# Patient Record
Sex: Male | Born: 1965 | Race: White | Hispanic: No | Marital: Married | Smoking: Former smoker
Health system: Southern US, Community
[De-identification: ages and names within clinical notes are randomized; demographics above are authoritative.]

## PROBLEM LIST (undated history)

## (undated) DIAGNOSIS — K5792 Diverticulitis of intestine, part unspecified, without perforation or abscess without bleeding: Secondary | ICD-10-CM

## (undated) DIAGNOSIS — K219 Gastro-esophageal reflux disease without esophagitis: Secondary | ICD-10-CM

## (undated) DIAGNOSIS — K31A0 Gastric intestinal metaplasia, unspecified: Secondary | ICD-10-CM

## (undated) DIAGNOSIS — K589 Irritable bowel syndrome without diarrhea: Secondary | ICD-10-CM

## (undated) DIAGNOSIS — K2970 Gastritis, unspecified, without bleeding: Secondary | ICD-10-CM

## (undated) DIAGNOSIS — K224 Dyskinesia of esophagus: Secondary | ICD-10-CM

## (undated) DIAGNOSIS — F32A Depression, unspecified: Secondary | ICD-10-CM

## (undated) DIAGNOSIS — K3189 Other diseases of stomach and duodenum: Secondary | ICD-10-CM

## (undated) DIAGNOSIS — I10 Essential (primary) hypertension: Secondary | ICD-10-CM

## (undated) DIAGNOSIS — F329 Major depressive disorder, single episode, unspecified: Secondary | ICD-10-CM

## (undated) DIAGNOSIS — K229 Disease of esophagus, unspecified: Secondary | ICD-10-CM

## (undated) HISTORY — PX: CHOLECYSTECTOMY: SHX55

## (undated) HISTORY — PX: GALLBLADDER SURGERY: SHX652

## (undated) HISTORY — PX: WRIST SURGERY: SHX841

## (undated) HISTORY — PX: COLONOSCOPY: SHX174

## (undated) HISTORY — PX: KNEE ARTHROSCOPY: SUR90

---

## 2005-09-23 ENCOUNTER — Emergency Department: Payer: Self-pay | Admitting: Emergency Medicine

## 2005-09-23 ENCOUNTER — Other Ambulatory Visit: Payer: Self-pay

## 2005-09-25 ENCOUNTER — Ambulatory Visit: Payer: Self-pay | Admitting: Emergency Medicine

## 2005-09-28 ENCOUNTER — Ambulatory Visit: Payer: Self-pay | Admitting: Family Medicine

## 2005-10-02 ENCOUNTER — Ambulatory Visit: Payer: Self-pay | Admitting: Family Medicine

## 2007-04-07 ENCOUNTER — Emergency Department: Payer: Self-pay | Admitting: Emergency Medicine

## 2007-04-18 ENCOUNTER — Emergency Department: Payer: Self-pay | Admitting: Emergency Medicine

## 2012-12-14 ENCOUNTER — Ambulatory Visit: Payer: Self-pay | Admitting: Orthopedic Surgery

## 2013-01-02 ENCOUNTER — Ambulatory Visit: Payer: Self-pay | Admitting: Orthopedic Surgery

## 2013-01-02 LAB — PROTIME-INR
INR: 0.9
Prothrombin Time: 12.8 secs (ref 11.5–14.7)

## 2013-01-02 LAB — BASIC METABOLIC PANEL
BUN: 13 mg/dL (ref 7–18)
Co2: 29 mmol/L (ref 21–32)
Glucose: 97 mg/dL (ref 65–99)
Osmolality: 283 (ref 275–301)

## 2013-01-02 LAB — CBC
MCV: 91 fL (ref 80–100)
Platelet: 155 10*3/uL (ref 150–440)
RBC: 4.24 10*6/uL — ABNORMAL LOW (ref 4.40–5.90)
RDW: 12.7 % (ref 11.5–14.5)

## 2013-01-10 ENCOUNTER — Inpatient Hospital Stay: Payer: Self-pay | Admitting: Gastroenterology

## 2013-01-10 LAB — CBC WITH DIFFERENTIAL/PLATELET
Eosinophil #: 0.1 10*3/uL (ref 0.0–0.7)
Eosinophil %: 2.2 %
Lymphocyte #: 1.3 10*3/uL (ref 1.0–3.6)
Lymphocyte %: 32.5 %
MCV: 91 fL (ref 80–100)
Monocyte #: 0.3 x10 3/mm (ref 0.2–1.0)
Monocyte %: 8.3 %
Neutrophil #: 2.2 10*3/uL (ref 1.4–6.5)
RBC: 4.86 10*6/uL (ref 4.40–5.90)
RDW: 12.6 % (ref 11.5–14.5)

## 2013-01-10 LAB — COMPREHENSIVE METABOLIC PANEL
Anion Gap: 5 — ABNORMAL LOW (ref 7–16)
Bilirubin,Total: 0.7 mg/dL (ref 0.2–1.0)
Calcium, Total: 8.6 mg/dL (ref 8.5–10.1)
Chloride: 109 mmol/L — ABNORMAL HIGH (ref 98–107)
Co2: 27 mmol/L (ref 21–32)
Creatinine: 1.1 mg/dL (ref 0.60–1.30)
EGFR (Non-African Amer.): >60
Glucose: 93 mg/dL (ref 65–99)
Potassium: 4.3 mmol/L (ref 3.5–5.1)
SGOT(AST): 156 U/L — ABNORMAL HIGH (ref 15–37)

## 2013-01-12 LAB — COMPREHENSIVE METABOLIC PANEL
Alkaline Phosphatase: 97 U/L (ref 50–136)
BUN: 11 mg/dL (ref 7–18)
Bilirubin,Total: 0.5 mg/dL (ref 0.2–1.0)
Calcium, Total: 8.6 mg/dL (ref 8.5–10.1)
Chloride: 109 mmol/L — ABNORMAL HIGH (ref 98–107)
Co2: 27 mmol/L (ref 21–32)
Creatinine: 1.19 mg/dL (ref 0.60–1.30)
EGFR (African American): >60
EGFR (Non-African Amer.): >60
Glucose: 96 mg/dL (ref 65–99)
Osmolality: 281 (ref 275–301)
Potassium: 3.9 mmol/L (ref 3.5–5.1)
SGOT(AST): 103 U/L — ABNORMAL HIGH (ref 15–37)
SGPT (ALT): 202 U/L — ABNORMAL HIGH (ref 12–78)
Sodium: 141 mmol/L (ref 136–145)
Total Protein: 6.5 g/dL (ref 6.4–8.2)

## 2013-01-12 LAB — CBC WITH DIFFERENTIAL/PLATELET
Basophil %: 0.8 %
Eosinophil #: 0.1 10*3/uL (ref 0.0–0.7)
Eosinophil %: 2.2 %
Lymphocyte #: 1.8 10*3/uL (ref 1.0–3.6)
Lymphocyte %: 35.5 %
MCV: 91 fL (ref 80–100)
Monocyte #: 0.4 x10 3/mm (ref 0.2–1.0)
Neutrophil #: 2.7 10*3/uL (ref 1.4–6.5)
Platelet: 175 10*3/uL (ref 150–440)
RDW: 12.6 % (ref 11.5–14.5)
WBC: 5.1 10*3/uL (ref 3.8–10.6)

## 2013-01-14 LAB — HEPATIC FUNCTION PANEL A (ARMC)
Bilirubin, Direct: 0.2 mg/dL (ref 0.00–0.20)
SGOT(AST): 71 U/L — ABNORMAL HIGH (ref 15–37)

## 2013-02-27 ENCOUNTER — Ambulatory Visit: Payer: Self-pay | Admitting: Gastroenterology

## 2013-04-10 ENCOUNTER — Ambulatory Visit: Payer: Self-pay | Admitting: Orthopedic Surgery

## 2013-04-10 LAB — PROTIME-INR
INR: 0.9
Prothrombin Time: 12.8 secs (ref 11.5–14.7)

## 2013-04-10 LAB — CBC
HGB: 14 g/dL (ref 13.0–18.0)
MCH: 31.5 pg (ref 26.0–34.0)
RBC: 4.43 10*6/uL (ref 4.40–5.90)
RDW: 12.9 % (ref 11.5–14.5)
WBC: 5.6 10*3/uL (ref 3.8–10.6)

## 2013-04-10 LAB — APTT: Activated PTT: 27.8 secs (ref 23.6–35.9)

## 2013-04-10 LAB — URINALYSIS, COMPLETE
Bilirubin,UR: NEGATIVE
Blood: NEGATIVE
Nitrite: NEGATIVE
Ph: 7 (ref 4.5–8.0)
RBC,UR: <1 /HPF (ref 0–5)
Specific Gravity: 1.008 (ref 1.003–1.030)
Squamous Epithelial: NONE SEEN
WBC UR: <1 /HPF (ref 0–5)

## 2013-04-10 LAB — BASIC METABOLIC PANEL
Anion Gap: 3 — ABNORMAL LOW (ref 7–16)
Calcium, Total: 9.2 mg/dL (ref 8.5–10.1)
Co2: 31 mmol/L (ref 21–32)
Creatinine: 1.03 mg/dL (ref 0.60–1.30)
EGFR (Non-African Amer.): >60
Potassium: 3.8 mmol/L (ref 3.5–5.1)

## 2013-04-18 ENCOUNTER — Ambulatory Visit: Payer: Self-pay | Admitting: Orthopedic Surgery

## 2014-01-10 ENCOUNTER — Ambulatory Visit: Payer: Self-pay | Admitting: Orthopedic Surgery

## 2014-02-09 ENCOUNTER — Ambulatory Visit: Payer: Self-pay | Admitting: Orthopedic Surgery

## 2014-02-09 LAB — BASIC METABOLIC PANEL
Anion Gap: 3 — ABNORMAL LOW (ref 7–16)
BUN: 15 mg/dL (ref 7–18)
Calcium, Total: 8.8 mg/dL (ref 8.5–10.1)
Chloride: 109 mmol/L — ABNORMAL HIGH (ref 98–107)
Co2: 29 mmol/L (ref 21–32)
Creatinine: 1.21 mg/dL (ref 0.60–1.30)
EGFR (African American): >60
Glucose: 85 mg/dL (ref 65–99)
OSMOLALITY: 281 (ref 275–301)
POTASSIUM: 3.7 mmol/L (ref 3.5–5.1)
Sodium: 141 mmol/L (ref 136–145)

## 2014-02-09 LAB — CBC
HCT: 40.9 % (ref 40.0–52.0)
HGB: 13.9 g/dL (ref 13.0–18.0)
MCH: 30.7 pg (ref 26.0–34.0)
MCHC: 33.9 g/dL (ref 32.0–36.0)
MCV: 91 fL (ref 80–100)
PLATELETS: 154 10*3/uL (ref 150–440)
RBC: 4.51 10*6/uL (ref 4.40–5.90)
RDW: 12.6 % (ref 11.5–14.5)
WBC: 6 10*3/uL (ref 3.8–10.6)

## 2014-02-09 LAB — APTT: Activated PTT: 28.1 secs (ref 23.6–35.9)

## 2014-02-09 LAB — SEDIMENTATION RATE: Erythrocyte Sed Rate: 5 mm/hr (ref 0–15)

## 2014-02-09 LAB — PROTIME-INR
INR: 1
Prothrombin Time: 12.8 secs (ref 11.5–14.7)

## 2014-02-12 ENCOUNTER — Ambulatory Visit: Payer: Self-pay | Admitting: Orthopedic Surgery

## 2014-08-04 ENCOUNTER — Emergency Department: Payer: Self-pay | Admitting: Emergency Medicine

## 2014-08-04 LAB — TROPONIN I: Troponin-I: <0.02 ng/mL

## 2014-08-04 LAB — BASIC METABOLIC PANEL
ANION GAP: 6 — AB (ref 7–16)
BUN: 14 mg/dL (ref 7–18)
Calcium, Total: 9 mg/dL (ref 8.5–10.1)
Chloride: 109 mmol/L — ABNORMAL HIGH (ref 98–107)
Co2: 26 mmol/L (ref 21–32)
Creatinine: 1.18 mg/dL (ref 0.60–1.30)
Glucose: 123 mg/dL — ABNORMAL HIGH (ref 65–99)
OSMOLALITY: 283 (ref 275–301)
POTASSIUM: 4 mmol/L (ref 3.5–5.1)
Sodium: 141 mmol/L (ref 136–145)

## 2014-08-04 LAB — CBC
HCT: 47.5 % (ref 40.0–52.0)
HGB: 15.6 g/dL (ref 13.0–18.0)
MCH: 29.8 pg (ref 26.0–34.0)
MCHC: 32.8 g/dL (ref 32.0–36.0)
MCV: 91 fL (ref 80–100)
Platelet: 193 10*3/uL (ref 150–440)
RBC: 5.23 10*6/uL (ref 4.40–5.90)
RDW: 12.3 % (ref 11.5–14.5)
WBC: 9.6 10*3/uL (ref 3.8–10.6)

## 2014-08-05 ENCOUNTER — Ambulatory Visit: Payer: Self-pay | Admitting: Gastroenterology

## 2014-08-28 ENCOUNTER — Ambulatory Visit: Payer: Self-pay | Admitting: Gastroenterology

## 2014-09-03 LAB — PATHOLOGY REPORT

## 2014-09-09 ENCOUNTER — Ambulatory Visit: Payer: Self-pay | Admitting: Gastroenterology

## 2014-10-08 DIAGNOSIS — K589 Irritable bowel syndrome without diarrhea: Secondary | ICD-10-CM | POA: Insufficient documentation

## 2014-11-06 ENCOUNTER — Ambulatory Visit: Payer: Self-pay | Admitting: Orthopedic Surgery

## 2015-03-26 NOTE — Consult Note (Signed)
Chief Complaint:  Subjective/Chief Complaint Sl improvement. Still with LLQ abd pain. Some rectal bleeding yest. Poor appetite. LFT abnormal but CT of liver normal. Hep A/B/C serologies neg.   VITAL SIGNS/ANCILLARY NOTES: **Vital Signs.:   09-Feb-14 05:31  Vital Signs Type Routine  Temperature Temperature (F) 97.7  Celsius 36.5  Temperature Source oral  Pulse Pulse 68  Respirations Respirations 20  Systolic BP Systolic BP 200  Diastolic BP (mmHg) Diastolic BP (mmHg) 89  Mean BP 105  Pulse Ox % Pulse Ox % 93  Pulse Ox Activity Level  At rest  Oxygen Delivery Room Air/ 21 %   Brief Assessment:  Cardiac Regular   Respiratory clear BS   Gastrointestinal LLQ abd tenderness   Lab Results: Hepatic:  09-Feb-14 04:33   Bilirubin, Total 0.5  Alkaline Phosphatase 97  SGPT (ALT)  202  SGOT (AST)  103  Total Protein, Serum 6.5  Albumin, Serum 3.6  Routine Chem:  09-Feb-14 04:33   Glucose, Serum 96  BUN 11  Creatinine (comp) 1.19  Sodium, Serum 141  Potassium, Serum 3.9  Chloride, Serum  109  CO2, Serum 27  Calcium (Total), Serum 8.6  Osmolality (calc) 281  eGFR (African American) >60  eGFR (Non-African American) >60 (eGFR values <43m/min/1.73 m2 may be an indication of chronic kidney disease (CKD). Calculated eGFR is useful in patients with stable renal function. The eGFR calculation will not be reliable in acutely ill patients when serum creatinine is changing rapidly. It is not useful in  patients on dialysis. The eGFR calculation may not be applicable to patients at the low and high extremes of body sizes, pregnant women, and vegetarians.)  Anion Gap  5  Routine Hem:  09-Feb-14 04:33   WBC (CBC) 5.1  RBC (CBC) 4.66  Hemoglobin (CBC) 14.5  Hematocrit (CBC) 42.2  Platelet Count (CBC) 175  MCV 91  MCH 31.1  MCHC 34.3  RDW 12.6  Neutrophil % 53.9  Lymphocyte % 35.5  Monocyte % 7.6  Eosinophil % 2.2  Basophil % 0.8  Neutrophil # 2.7  Lymphocyte # 1.8   Monocyte # 0.4  Eosinophil # 0.1  Basophil # 0.0 (Result(s) reported on 12 Jan 2013 at 06:04AM.)   Assessment/Plan:  Assessment/Plan:  Assessment Poss diverticulitis.   Plan Continue Abx. Moniter LFT. If no signif improvement, consider flex sig/colonoscopy to evaluate left side of colon. Dr. SGustavo Lahto resuming seeing patient tomorrow. Thanks.   Electronic Signatures: OVerdie Shire(MD)  (Signed 09-Feb-14 10:05)  Authored: Chief Complaint, VITAL SIGNS/ANCILLARY NOTES, Brief Assessment, Lab Results, Assessment/Plan   Last Updated: 09-Feb-14 10:05 by OVerdie Shire(MD)

## 2015-03-26 NOTE — Op Note (Signed)
PATIENT NAME:  Mark, Bates MR#:  [id number] DATE OF BIRTH:  Sep 18, 1966  DATE OF PROCEDURE:  04/18/2013  PREOPERATIVE DIAGNOSIS: Left knee medial meniscus tear.  POSTOPERATIVE DIAGNOSIS: Left knee medial meniscus tear.  PROCEDURE:  Left knee arthroscopic partial medial meniscectomy.   ANESTHESIA:  General.   SPECIMENS:  None.  SURGEON:  Dr. Thornton Park  ESTIMATED BLOOD LOSS:  Minimal.   COMPLICATIONS:  None.   INDICATIONS FOR PROCEDURE: Mark Bates is a 49 year old male who has had persistent left knee pain with mechanical symptoms. An MRI had revealed a tear of the medial meniscus within the body and extending to the posterior horn. The patient had not responded to nonoperative management and had wished to proceed with arthroscopic partial medial meniscectomy. His initial surgery was delayed because he had developed diverticulitis. He has now been cleared for surgery. The patient's initial injury began when he twisted his knee rising from his desk at work. His symptoms began in approximately December 2013. He had a previous history of a right bucket handle meniscal tear on the right knee, which was treated arthroscopically.   I reviewed the risks and benefits of surgery with Mark Bates. He understands the risks include infection, bleeding, nerve or blood vessel injury, knee stiffness, persistent pain, osteoarthritis and the need for further surgery. Medical complications include DVT and pulmonary embolism, myocardial infarction, stroke, pneumonia, respiratory failure and death. The patient understood these risks and wished to proceed.   PROCEDURE NOTE:  Mark Bates was met in the preop area with his wife at the bedside. I signed the left knee within the operative field according to the hospital's right-site protocol. I signed his knee after verbally confirming with the patient that this was the correct site of surgery. This was also confirmed using my office notes and his radiographic  studies. I updated the patient's history and physical. I answered all the questions by the patient and his wife.   The patient was brought to the operating room where he was placed supine on the operative table. He underwent general anesthesia. All bony prominences were adequately padded. The patient was prepped and draped in a sterile fashion.   A timeout was performed to verify the patient's name, date of birth, medical record number, correct site of surgery and correct procedure to be performed. It was also used to verify the patient had received antibiotics and all appropriate instruments, implants and radiographic studies were available in the room. Once all in attendance were in agreement, the case began.   Examination under anesthesia of the left knee demonstrated no evidence of ligamentous laxity. He had full range of motion.  A #11 blade was used to establish an inferolateral portal. The proposed incisions had been drawn out with a surgical marker based upon bony landmarks. The incision sites were preinjected with 1% lidocaine plain. The arthroscope was placed through the inferolateral portal and placed in the suprapatellar pouch as the knee was gently brought into extension. A full diagnostic examination of the knee was performed including the suprapatellar pouch, the medial and lateral gutters, the medial and lateral compartments and the intercondylar notch, as well as the posterior knee. Findings on arthroscopy included a complex tear of the medial meniscus extending from the body into the posterior horn. There was mild fissuring of the central portion of the undersurface of the patella, as well as the lateral femoral condyle. No focal chondral defects were seen in any compartment of the knee. The patient also had  hypertrophy of Hoffa's fat pad extending into the lateral compartment, as well as a fibrous scar band extending from the anterolateral knee into the intercondylar notch.   An  inferomedial portal was established under direct visualization using the 18-gauge spinal needle for localization. A 4-0 resector shaver blade was then used to debride the scar band and the Hoffa's fat pad laterally. The patient was having anterolateral knee pain which may have been from impingement of Hoffa's fat pad.   The attention was then turned to the medial meniscus. A 4-0 resector shaver blade was used to debride the body along with a straight basket for debridement. The tear was found to be extensive and involved the undersurface of the medial meniscus. This was thoroughly debrided. The tear also extended in a horizontal fashion into the posterior horn of the medial meniscus. An up-biting basket and a 3.5 mm resector shaver blade were used to perform the debridement on the posterior horn avoiding injury to the medial femoral condyle or tibial plateau. The arthroscope was then placed through the inferomedial portal and the straight basket and 4-0 resector shaver blade were used to debride the anterior portion of the body and to contour the meniscus into the anterior horn.  Final images of the partial medial meniscectomy were performed, Thorough irrigation of the left knee was then performed. The knee was then drained of fluid and the arthroscopic instruments were removed. A 4-0 nylon suture was used to close both the medial and lateral portals. Steri-Strips were applied. The patient was injected with 0.25% Marcaine with epi intra-articularly for a total of 20 mL. Dry sterile compressive bandage was then applied to the left knee. The patient was then awakened and brought to the PACU in stable condition. I was scrubbed and present for the entire case and all sharp and instrument counts were correct at the conclusion of the case. I met with the patient's wife in the postoperative consultation room to let her know the case had gone without complication. The patient was stable in the recovery  room.   ____________________________ Timoteo Gaul, MD klk:ce D: 04/18/2013 12:00:43 ET T: 04/18/2013 12:12:55 ET JOB#: [id number]  cc: Timoteo Gaul, MD, <Dictator> Timoteo Gaul MD ELECTRONICALLY SIGNED 04/22/2013 10:34

## 2015-03-26 NOTE — Consult Note (Signed)
Chief Complaint:  Subjective/Chief Complaint Seen for abdominal pain/diverticulitis. feeling better today, pain 2/10, tolerating low residue diet.  no n/v   VITAL SIGNS/ANCILLARY NOTES: **Vital Signs.:   11-Feb-14 14:11  Vital Signs Type Routine  Temperature Temperature (F) 98.2  Celsius 36.7  Temperature Source oral  Pulse Pulse 75  Respirations Respirations 20  Systolic BP Systolic BP 133  Diastolic BP (mmHg) Diastolic BP (mmHg) 88  Mean BP 103  Pulse Ox % Pulse Ox % 95  Pulse Ox Activity Level  At rest  Oxygen Delivery Room Air/ 21 %   Brief Assessment:  Cardiac Regular   Respiratory clear BS   Gastrointestinal details normal Soft  Nondistended  No masses palpable  Bowel sounds normal  No rebound tenderness  No gaurding  mild tenderness llq, improved   Lab Results: Hepatic:  11-Feb-14 04:06   Bilirubin, Total 0.5  Bilirubin, Direct 0.2 (Result(s) reported on 14 Jan 2013 at 05:51AM.)  Alkaline Phosphatase 105  SGPT (ALT)  178  SGOT (AST)  71  Total Protein, Serum 6.7  Albumin, Serum 3.6   Assessment/Plan:  Assessment/Plan:  Assessment 1) llq pain c/w diverticulitis in the setting of previousl diverticulitis.  2) abnormal lfts-improving, likely reavctive hepatopathy   Plan 1) will d/c today, continue po abx, fu GI clinic in one week. low residue diet.   Electronic Signatures: Barnetta ChapelSkulskie, Nold (MD)  (Signed 11-Feb-14 17:07)  Authored: Chief Complaint, VITAL SIGNS/ANCILLARY NOTES, Brief Assessment, Lab Results, Assessment/Plan   Last Updated: 11-Feb-14 17:07 by Barnetta ChapelSkulskie, Mohrmann (MD)

## 2015-03-26 NOTE — Consult Note (Signed)
Chief Complaint:  Subjective/Chief Complaint seen for diverticulitis.  was feeling better yesterday, however increased llq pain malaise this am.  Pain currently 3/10.  minimal nausea.   VITAL SIGNS/ANCILLARY NOTES: **Vital Signs.:   10-Feb-14 13:40  Vital Signs Type Routine  Temperature Temperature (F) 97.8  Celsius 36.5  Temperature Source oral  Pulse Pulse 73  Respirations Respirations 20  Systolic BP Systolic BP 120  Diastolic BP (mmHg) Diastolic BP (mmHg) 78  Mean BP 92  Pulse Ox % Pulse Ox % 95  Pulse Ox Activity Level  At rest  Oxygen Delivery Room Air/ 21 %   Brief Assessment:  Cardiac Regular   Respiratory clear BS   Gastrointestinal details normal Soft  Nondistended  No masses palpable  Bowel sounds normal  mild tenderness llq, improved form friday   Lab Results: Hepatic:  09-Feb-14 04:33   Bilirubin, Total 0.5  Alkaline Phosphatase 97  SGPT (ALT)  202  SGOT (AST)  103  Total Protein, Serum 6.5  Albumin, Serum 3.6  Routine Chem:  09-Feb-14 04:33   Glucose, Serum 96  BUN 11  Creatinine (comp) 1.19  Sodium, Serum 141  Potassium, Serum 3.9  Chloride, Serum  109  CO2, Serum 27  Calcium (Total), Serum 8.6  Osmolality (calc) 281  eGFR (African American) >60  eGFR (Non-African American) >60 (eGFR values <52mL/min/1.73 m2 may be an indication of chronic kidney disease (CKD). Calculated eGFR is useful in patients with stable renal function. The eGFR calculation will not be reliable in acutely ill patients when serum creatinine is changing rapidly. It is not useful in  patients on dialysis. The eGFR calculation may not be applicable to patients at the low and high extremes of body sizes, pregnant women, and vegetarians.)  Anion Gap  5  Routine Hem:  09-Feb-14 04:33   WBC (CBC) 5.1  RBC (CBC) 4.66  Hemoglobin (CBC) 14.5  Hematocrit (CBC) 42.2  Platelet Count (CBC) 175  MCV 91  MCH 31.1  MCHC 34.3  RDW 12.6  Neutrophil % 53.9  Lymphocyte % 35.5   Monocyte % 7.6  Eosinophil % 2.2  Basophil % 0.8  Neutrophil # 2.7  Lymphocyte # 1.8  Monocyte # 0.4  Eosinophil # 0.1  Basophil # 0.0 (Result(s) reported on 12 Jan 2013 at [facility].)   Radiology Results: CT:    07-Feb-14 13:15, CT Abdomen and Pelvis With Contrast  CT Abdomen and Pelvis With Contrast   REASON FOR EXAM:    (1) diverticulitis; (2) diverticulitis  COMMENTS:       PROCEDURE: CT  - CT ABDOMEN / PELVIS  W  - Jan 10 2013  1:15PM     RESULT: History: Diverticulitis    Comparison:  10/02/2005    Technique: Multiple axial images of the abdomen and pelvis were performed   from the lung bases to the pubic symphysis, with p.o. contrast and with   100 ml of Isovue 370 intravenous contrast.    Findings:  The lung bases are clear. There is no pneumothorax. The heart size is   normal.     The liver demonstrates no focal abnormality. There is no intrahepatic or   extrahepatic biliary ductal dilatation. The gallbladder is surgically   absent. The spleen demonstrates no focal abnormality. The kidneys,   adrenal glands, and pancreas are normal.The bladder is unremarkable.     The stomach, duodenum, small intestine, and large intestine demonstrate   no contrast extravasation or dilatation. There is diverticulosis without  evidence of diverticulitis. There is a normal caliber appendix in the   right lower quadrant without periappendiceal inflammatory changes. There   is no pneumoperitoneum, pneumatosis, or portal venous gas. There is no   abdominal or pelvic free fluid. There is no lymphadenopathy.   The abdominal aorta is normal in caliber with atherosclerosis.    The osseous structures are unremarkable.    IMPRESSION:     1. No acute abdominal or pelvic pathology.    Dictation Site: 1        Verified By: Jennette Banker, M.D., MD   Assessment/Plan:  Assessment/Plan:  Assessment 1) llq pain-c/w diverticulitis in the setting of previous episode.  currently on iv  cipro and flagyl.   2) abnormal lfts-possibly reactive vs due to abx.  Serologies for chronic hepatitis   Plan 1) continue current, will advance diet, ambulate, ted hose. might go home tomorrow   Electronic Signatures: Loistine Simas (MD)  (Signed 10-Feb-14 17:06)  Authored: Chief Complaint, VITAL SIGNS/ANCILLARY NOTES, Brief Assessment, Lab Results, Radiology Results, Assessment/Plan   Last Updated: 10-Feb-14 17:06 by Loistine Simas (MD)

## 2015-03-26 NOTE — Discharge Summary (Signed)
Dates of Admission and Diagnosis:  Date of Admission 10-Jan-2013   Date of Discharge 01-Jan-0001   Admitting Diagnosis abdominal pain, diverticulitis   Final Diagnosis diverticulitis   Discharge Diagnosis 1 diverticulitis   2 abnormal liver test, reactive hepatopathy    Chief Complaint/History of Present Illness patietn seen in outpatient clinic for llq abdominal pain last week, was placed on oral antibiotics, with continued increased sx.  Probable failure of outpatietn treatment, was admitted to have iv treatemtn.   Hepatic:  07-Feb-14 11:14   Bilirubin, Total 0.7  Alkaline Phosphatase 106  SGPT (ALT)  189  SGOT (AST)  156  Total Protein, Serum 6.9  Albumin, Serum 3.8  09-Feb-14 04:33   Bilirubin, Total 0.5  Alkaline Phosphatase 97  SGPT (ALT)  202  SGOT (AST)  103  Total Protein, Serum 6.5  Albumin, Serum 3.6  11-Feb-14 04:06   Bilirubin, Total 0.5  Bilirubin, Direct 0.2 (Result(s) reported on 14 Jan 2013 at 05:51AM.)  Alkaline Phosphatase 105  SGPT (ALT)  178  SGOT (AST)  71  Total Protein, Serum 6.7  Albumin, Serum 3.6  General Ref:  07-Feb-14 16:10   Acetaminophen, Serum  3 (10-30 POTENTIALLY TOXIC:  > 200 mcg/mL  > 50 mcg/mL at 12 hr after  ingestion  > 300 mcg/mL at 4 hr after  ingestion)  Hepatitis A Antibody, IgM ========== TEST NAME ==========  ========= RESULTS =========  = REFERENCE RANGE =  HEPATITIS A, IgM  Hep A Ab, IgM Hep A Ab, IgM                   [   Negative             ]          Negative               [facility]            No: [id number]           [address], [place], [place] [address]           Lindon Romp, MD         [phone]   Result(s) reported on 11 Jan 2013 at 10:48AM.  Hepatitis A Antibody, Total ========== TEST NAME ==========  ========= RESULTS =========  = REFERENCE RANGE =  HEPATITIS A ANTIBDY,TOTL  Hep A Ab, Total Hep A Ab, Total                 [   Negative             ]          Negative               [facility]   No: [id number]           [address], [place], [place] [address]           Lindon Romp, MD         [phone]   Result(s) reported on 11 Jan 2013 at 10:48AM.  Hepatitis B Surface Antibody, Qual ========== TEST NAME ==========  ========= RESULTS =========  = REFERENCE RANGE =  HEPATITIS B SURF.AB,QUAL  Hep B Surface Ab Hep B Surface Ab, Qual          [   Non Reactive         ]  Non Reactive: Inconsistent with immunity,                                            less than 10 mIU/mL                              Reactive:     Consistent with immunity,                                            greater than 9.9 mIU/mL               [facility]            No: [id number]           [address], [place], [place] [address]           Lindon Romp, MD         [phone]   Result(s) reported on 11 Jan 2013 at 10:48AM.  HBsAg ========== TEST NAME ==========  ========= RESULTS =========  = REFERENCE RANGE =  HEPATITIS B SURFACE AG  HBsAg Screen HBsAg Screen                    [   Negative             ]          Negative               [facility]            No: [id number]           [address], [place], [place] [address]           Lindon Romp, MD         [phone]   Result(s) reported on 11 Jan 2013 at 05:23PM.  Hepatitis C Virus Antibody ========== TEST NAME ==========  ========= RESULTS =========  = REFERENCE RANGE =  HCV ANTIBODY  HCV Antibody Hep C Virus Ab                  [   <0.1 s/co ratio      ]           0.0-0.9                                                  Negative:     < 0.8                                              Indeterminate 0.8 - 0.9                                                  Positive:     > 0.9                                                                      .  In order to reduce the incidence of a  false positive                  result, the CDC recommends that all s/co ratios                  between 1.0 and 10.9 be confirmed by a more specific                  supplemental or PCR testing. [facility] offers HCV Ab                 w/Reflex to Verification test [phone].               [facility]            No: [id number]           [address] [place], [place] [address]           Lindon Romp, MD         [phone]   Result(s) reportedon 11 Jan 2013 at 10:48AM.  Routine Chem:  07-Feb-14 11:14   Glucose, Serum 93  BUN 14  Creatinine (comp) 1.10  Sodium, Serum 141  Potassium, Serum 4.3  Chloride, Serum  109  CO2, Serum 27  Calcium (Total), Serum 8.6  Osmolality (calc) 281  eGFR (African American) >60  eGFR (Non-African American) >60 (eGFR values <59mL/min/1.73 m2 may be an indication of chronic kidney disease (CKD). Calculated eGFR is useful in patients with stable renal function. The eGFR calculation will not be reliable in acutely ill patients when serum creatinine is changing rapidly. It is not useful in  patients on dialysis. The eGFR calculation may not be applicable to patients at the low and high extremes of body sizes, pregnant women, and vegetarians.)  Anion Gap  5  09-Feb-14 04:33   Glucose, Serum 96  BUN 11  Creatinine (comp) 1.19  Sodium, Serum 141  Potassium, Serum 3.9  Chloride, Serum  109  CO2, Serum 27  Calcium (Total), Serum 8.6  Osmolality (calc) 281  eGFR (African American) >60  eGFR (Non-African American) >60 (eGFR values <2mL/min/1.73 m2 may be an indication of chronic kidney disease (CKD). Calculated eGFR is useful in patients with stable renal function. The eGFR calculation will not be reliable in acutely ill patients when serum creatinine is changing rapidly. It is not useful in  patients on dialysis. The eGFR calculation may not be applicable to patients at the low and high extremes of body sizes, pregnant women, and  vegetarians.)  Anion Gap  5  Routine Hem:  07-Feb-14 11:14   WBC (CBC) 4.1  RBC (CBC) 4.86  Hemoglobin (CBC) 14.5  Hematocrit (CBC) 44.0  Platelet Count (CBC) 174  MCV 91  MCH 29.9  MCHC 33.0  RDW 12.6  Neutrophil % 53.0  Lymphocyte % 32.5  Monocyte % 8.3  Eosinophil % 2.2  Basophil % 4.0  Neutrophil # 2.2  Lymphocyte # 1.3  Monocyte # 0.3  Eosinophil # 0.1  Basophil #  0.2 (Result(s) reported on 10 Jan 2013 at 11:33AM.)  09-Feb-14 04:33   WBC (CBC) 5.1  RBC (CBC) 4.66  Hemoglobin (CBC) 14.5  Hematocrit (CBC) 42.2  Platelet Count (CBC) 175  MCV 91  MCH 31.1  MCHC 34.3  RDW 12.6  Neutrophil % 53.9  Lymphocyte % 35.5  Monocyte % 7.6  Eosinophil % 2.2  Basophil % 0.8  Neutrophil # 2.7  Lymphocyte # 1.8  Monocyte #  0.4  Eosinophil # 0.1  Basophil # 0.0 (Result(s) reported on 12 Jan 2013 at 06:04AM.)   PERTINENT RADIOLOGY STUDIES: LabUnknown:    07-Feb-14 13:15, CT Abdomen and Pelvis With Contrast  PACS Image   CT:  CT Abdomen and Pelvis With Contrast   REASON FOR EXAM:    (1) diverticulitis; (2) diverticulitis  COMMENTS:       PROCEDURE: CT  - CT ABDOMEN / PELVIS  W  - Jan 10 2013  1:15PM     RESULT: History: Diverticulitis    Comparison:  10/02/2005    Technique: Multiple axial images of the abdomen and pelvis were performed   from the lung bases to the pubic symphysis, with p.o. contrast and with   100 ml of Isovue 370 intravenous contrast.    Findings:  The lung bases are clear. There is no pneumothorax. The heart size is   normal.     The liver demonstrates no focal abnormality. There is no intrahepatic or   extrahepatic biliary ductal dilatation. The gallbladder is surgically   absent. The spleen demonstrates no focal abnormality. The kidneys,   adrenal glands, and pancreas are normal.The bladder is unremarkable.     The stomach, duodenum, small intestine, and large intestine demonstrate   no contrast extravasation or dilatation. There is  diverticulosis without   evidence of diverticulitis. There is a normal caliber appendix in the   right lower quadrant without periappendiceal inflammatory changes. There   is no pneumoperitoneum, pneumatosis, or portal venous gas. There is no   abdominal or pelvic free fluid. There is no lymphadenopathy.   The abdominal aorta is normal in caliber with atherosclerosis.    The osseous structures are unremarkable.    IMPRESSION:     1. No acute abdominal or pelvic pathology.    Dictation Site: 1        Verified By: Jennette Banker, M.D., MD   Hospital Course:  Hospital Course slow improvement.  Liver tests were noted to be elevated, with acute hepatitis serologies negative, likely reactive hepatopathy in nature.  Improving over the course of hospitalization.   Condition on Discharge Good   DISCHARGE INSTRUCTIONS HOME MEDS:  Medication Reconciliation: Patient's Home Medications at Discharge:     Medication Instructions  dexilant 60 mg oral delayed release capsule  1 cap(s) orally once a day (in the morning)   one daily men's health multivitamin tablet  1 tab(s) orally once a day   cipro 500 mg oral tablet  1 tab(s) orally every 12 hours x 7 days   metronidazole 250 mg oral tablet  1 tab(s) orally 4 times a day x 7 days   acetaminophen-hydrocodone 325 mg-5 mg oral tablet  1 tab(s) orally every 6 hours, As Needed, pain , As needed, pain    PRESCRIPTIONS: PRINTED AND PLACED ON CHART  STOP TAKING THE FOLLOWING MEDICATION(S):    florastor 250 mg oral capsule: 1 cap(s) orally once a day ondansetron 4 mg oral tablet: 1 tab(s) orally every 6 hours, As Needed tylenol arthritis caplet 650 mg oral tablet, extended release: 2 tab(s) orally every 8 hours, As Needed  Physician's Instructions:  Home Health? No   Treatments None   Home Oxygen? No   Diet low residue   Activity Limitations As tolerated   Return to Work 1 week   Time frame for Follow Up Appointment 1-2 weeks  Dr  Gustavo Lah   Electronic Signatures: Loistine Simas (MD)  (Signed 11-Feb-14 17:26)  Authored:  ADMISSION DATE AND DIAGNOSIS, CHIEF COMPLAINT/HPI, PERTINENT LABS, PERTINENT RADIOLOGY STUDIES, HOSPITAL COURSE, DISCHARGE INSTRUCTIONS HOME MEDS, PATIENT INSTRUCTIONS   Last Updated: 11-Feb-14 17:26 by Loistine Simas (MD)

## 2015-03-26 NOTE — Consult Note (Signed)
Chief Complaint:   Subjective/Chief Complaint Patient admitted from o/p clinic with diverticulitis, failure of outpatient treatment.  Please see admission history and physical.   Mark Bates started on o/p po abx several days ago with increasing sx of llq pain lowgrade fever and nausea.  Normal wbc and ct without overt diverticulitis or abscess.  Admitted for iv abx.   will place on full liquids for now.  Incedental finding of elevated transaminases, possible reactive hepatopathy.  Several labs pending.   VITAL SIGNS/ANCILLARY NOTES: **Vital Signs.:   07-Feb-14 13:40   Vital Signs Type Routine   Temperature Temperature (F) 97.9   Celsius 36.6   Temperature Source Oral   Pulse Pulse 62   Respirations Respirations 21   Systolic BP Systolic BP 615   Diastolic BP (mmHg) Diastolic BP (mmHg) 92   Mean BP 108   Pulse Ox % Pulse Ox % 97   Pulse Ox Activity Level  At rest   Oxygen Delivery Room Air/ 21 %   Brief Assessment:   Cardiac Regular     Respiratory clear BS     Gastrointestinal details normal Soft  Nondistended  No masses palpable  No rebound tenderness  distant low bowel sounds, positive llq pain to palpation    Lab Results:  Hepatic:  07-Feb-14 11:14    Bilirubin, Total 0.7   Alkaline Phosphatase 106   SGPT (ALT)  189   SGOT (AST)  156   Total Protein, Serum 6.9   Albumin, Serum 3.8  Routine Chem:  07-Feb-14 11:14    Glucose, Serum 93   BUN 14   Creatinine (comp) 1.10   Sodium, Serum 141   Potassium, Serum 4.3   Chloride, Serum  109   CO2, Serum 27   Calcium (Total), Serum 8.6   Osmolality (calc) 281   eGFR (African American) >60   eGFR (Non-African American) >60 (eGFR values <42mL/min/1.73 m2 may be an indication of chronic kidney disease (CKD). Calculated eGFR is useful in patients with stable renal function. The eGFR calculation will not be reliable in acutely ill patients when serum creatinine is changing rapidly. It is not useful in  patients on dialysis. The  eGFR calculation may not be applicable to patients at the low and high extremes of body sizes, pregnant women, and vegetarians.)   Anion Gap  5  Routine Hem:  07-Feb-14 11:14    WBC (CBC) 4.1   RBC (CBC) 4.86   Hemoglobin (CBC) 14.5   Hematocrit (CBC) 44.0   Platelet Count (CBC) 174   MCV 91   MCH 29.9   MCHC 33.0   RDW 12.6   Neutrophil % 53.0   Lymphocyte % 32.5   Monocyte % 8.3   Eosinophil % 2.2   Basophil % 4.0   Neutrophil # 2.2   Lymphocyte # 1.3   Monocyte # 0.3   Eosinophil # 0.1   Basophil #  0.2 (Result(s) reported on 10 Jan 2013 at 11:33AM.)   Assessment/Plan:  Assessment/Plan:   Assessment 1) diverticulitis 2) elevated transaminases-?reactive    Plan 1) admitted for iv abx.  will repeat cbc abd lfts in am. Dr Candace Cruise to see over the weekend   Electronic Signatures: Loistine Simas (MD)  (Signed 07-Feb-14 16:28)  Authored: Chief Complaint, VITAL SIGNS/ANCILLARY NOTES, Brief Assessment, Lab Results, Assessment/Plan   Last Updated: 07-Feb-14 16:28 by Loistine Simas (MD)

## 2015-03-26 NOTE — Consult Note (Signed)
Chief Complaint:   Subjective/Chief Complaint Covering for Dr. Gustavo Lah. Feels slightly better but still has LLQ abd pain and poor appetite.   VITAL SIGNS/ANCILLARY NOTES: **Vital Signs.:   08-Feb-14 08:11   Vital Signs Type Q 4hr   Temperature Temperature (F) 98.6   Celsius 37   Temperature Source oral   Pulse Pulse 81   Respirations Respirations 20   Systolic BP Systolic BP 449   Diastolic BP (mmHg) Diastolic BP (mmHg) 83   Mean BP 104   Pulse Ox % Pulse Ox % 94   Pulse Ox Activity Level  At rest   Oxygen Delivery Room Air/ 21 %   Brief Assessment:   Cardiac Regular    Respiratory clear BS    Gastrointestinal tender in LLQ abd area.   Lab Results:  Hepatic:  07-Feb-14 11:14    Bilirubin, Total 0.7   Alkaline Phosphatase 106   SGPT (ALT)  189   SGOT (AST)  156   Total Protein, Serum 6.9   Albumin, Serum 3.8  General Ref:  07-Feb-14 16:10    Acetaminophen, Serum  3 (10-30 POTENTIALLY TOXIC:  > 200 mcg/mL  > 50 mcg/mL at 12 hr after  ingestion  > 300 mcg/mL at 4 hr after  ingestion)  Routine Chem:  07-Feb-14 11:14    Glucose, Serum 93   BUN 14   Creatinine (comp) 1.10   Sodium, Serum 141   Potassium, Serum 4.3   Chloride, Serum  109   CO2, Serum 27   Calcium (Total), Serum 8.6   Osmolality (calc) 281   eGFR (African American) >60   eGFR (Non-African American) >60 (eGFR values <57mL/min/1.73 m2 may be an indication of chronic kidney disease (CKD). Calculated eGFR is useful in patients with stable renal function. The eGFR calculation will not be reliable in acutely ill patients when serum creatinine is changing rapidly. It is not useful in  patients on dialysis. The eGFR calculation may not be applicable to patients at the low and high extremes of body sizes, pregnant women, and vegetarians.)   Anion Gap  5  Routine Hem:  07-Feb-14 11:14    WBC (CBC) 4.1   RBC (CBC) 4.86   Hemoglobin (CBC) 14.5   Hematocrit (CBC) 44.0   Platelet Count (CBC) 174    MCV 91   MCH 29.9   MCHC 33.0   RDW 12.6   Neutrophil % 53.0   Lymphocyte % 32.5   Monocyte % 8.3   Eosinophil % 2.2   Basophil % 4.0   Neutrophil # 2.2   Lymphocyte # 1.3   Monocyte # 0.3   Eosinophil # 0.1   Basophil #  0.2 (Result(s) reported on 10 Jan 2013 at 11:33AM.)   Radiology Results: CT:    07-Feb-14 13:15, CT Abdomen and Pelvis With Contrast   CT Abdomen and Pelvis With Contrast    REASON FOR EXAM:    (1) diverticulitis; (2) diverticulitis  COMMENTS:       PROCEDURE: CT  - CT ABDOMEN / PELVIS  W  - Jan 10 2013  1:15PM     RESULT: History: Diverticulitis    Comparison:  10/02/2005    Technique: Multiple axial images of the abdomen and pelvis were performed   from the lung bases to the pubic symphysis, with p.o. contrast and with   100 ml of Isovue 370 intravenous contrast.    Findings:  The lung bases are clear. There is no pneumothorax. The heart size  is   normal.     The liver demonstrates no focal abnormality. There is no intrahepatic or   extrahepatic biliary ductal dilatation. The gallbladder is surgically   absent. The spleen demonstrates no focal abnormality. The kidneys,   adrenal glands, and pancreas are normal.The bladder is unremarkable.     The stomach, duodenum, small intestine, and large intestine demonstrate   no contrast extravasation or dilatation. There is diverticulosis without   evidence of diverticulitis. There is a normal caliber appendix in the   right lower quadrant without periappendiceal inflammatory changes. There   is no pneumoperitoneum, pneumatosis, or portal venous gas. There is no   abdominal or pelvic free fluid. There is no lymphadenopathy.   The abdominal aorta is normal in caliber with atherosclerosis.    The osseous structures are unremarkable.    IMPRESSION:     1. No acute abdominal or pelvic pathology.    Dictation Site: 1        Verified By: Jennette Banker, M.D., MD   Assessment/Plan:   Assessment/Plan:   Assessment Presumed diverticulitis without response to oral Abx though CT does not show diverticulitis.    Plan Continue IV Abx. Continue to [place] labs, incl LFT.   Electronic Signatures: Verdie Shire (MD)  (Signed 08-Feb-14 09:32)  Authored: Chief Complaint, VITAL SIGNS/ANCILLARY NOTES, Brief Assessment, Lab Results, Radiology Results, Assessment/Plan   Last Updated: 08-Feb-14 09:32 by Verdie Shire (MD)

## 2015-03-27 NOTE — Op Note (Signed)
PATIENT NAME:  Mark Bates, Mark Bates MR#:  [id number] DATE OF BIRTH:  04-28-1966  DATE OF PROCEDURE:  02/12/2014  PREOPERATIVE DIAGNOSIS: Left knee torn medial meniscus.  POSTOPERATIVE DIAGNOSIS: Left knee torn medial meniscus, frayed lateral meniscus, chondromalacia of the medial femoral condyle and synovitis of the anterolateral compartment.   PROCEDURES: Left knee arthroscopy with partial medial and lateral meniscectomies, chondroplasty of the medial femoral condyle and partial synovectomy.   SURGEON: Thornton Park, MD  ANESTHESIA: General.   ESTIMATED BLOOD LOSS: Minimal.   COMPLICATIONS: None.   INDICATIONS FOR PROCEDURE: Mark Bates is a 49 year old male who had undergone a previous left partial medial meniscectomy on May 16th of 2014. He had been doing well postoperatively until he developed increasing anterior lateral and medial knee pain after prolonged standing on his feet. The patient had a new MRI performed on his left knee. The MRI revealed an increased signal in the posterior horn of the medial meniscus consistent with retear of the meniscus.   Given the patient's pain and mechanical symptoms and inability to return to daily activity, I recommended repeat knee arthroscopy with repeat partial medial meniscectomy. I have reviewed the risks and benefits of surgery with the patient. He understood these risks which include infection, bleeding, nerve or blood vessel injury, knee stiffness, persistent pain or mechanical symptoms, re-tear of the meniscus and the need for further surgery. He also understands the risks included DVT and pulmonary embolism, myocardial infarction, stroke, pneumonia, respiratory failure and death.   PROCEDURE NOTE: The patient was met in the preop area. His wife was at the bedside. I signed the left knee with the word "yes", according to the hospital's right site protocol. This was after verbally confirming with the patient that this was the correct site of surgery. It  was also confirmed using my office notes and the radiographic study. I updated the patient's history and physical. He was then brought to the operating room where he was placed supine on the operative table. He was prepped and draped in a sterile fashion. A timeout was performed to verify the patient's name, date of birth, medical record number, correct site of surgery, and correct procedure to be performed. It was also used to confirm the patient had received antibiotics and that all appropriate instruments and radiographic studies were available in the room. Once all in attendance were in agreement, the case began.   The patient had proposed arthroscopy incisions drawn out with a surgical marker. An examination under anesthesia revealed full knee range of motion without mechanical symptoms or ligamentous laxity. The proposed arthroscopy incisions were injected with 1% lidocaine plain. An 11 blade was used to establish inferomedial and inferolateral portals. Through the inferolateral portal, the arthroscope was placed. A full diagnostic examination of the knee was undertaken including the suprapatellar pouch, patellofemoral joint, medial and lateral gutters, the medial and lateral compartments, the intercondylar notch, and the posterior knee.   Findings on arthroscopy included a torn medial meniscus which involved a large horizontal component. The patient had fraying of the cartilage of the medial femoral condyle as well as significant synovitis and scar tissue over the anterior and anterolateral knee.   The medial meniscus was addressed first. Partial medial meniscectomy was performed using a straight duckbill basket as well as an up-biting basket to reach the posterior aspect of the medial meniscus. Then 4.0 and 3.5 mm resector shaver blades were also used to contour the meniscus following debridement with the baskets. All meniscal debris was removed  using the shaver blades. Chondroplasty was performed of  the medial femoral condyle to remove frayed cartilage. Of note, there was no full-thickness cartilage defect. The ACL was intact and there were no loose bodies in the posterior knee. The lateral compartment showed fraying of the anterior horn of the lateral meniscus as well. Partial lateral meniscectomy was performed to remove the frayed and torn inner margin of the meniscus. Then an extensive debridement of synovitis and adhesions of the lateral aspect of the joint was performed using a 4-0 resector shaver blade and 90 degree [employer]thro[employer] wand. Final arthroscopic images were taken of the patient's knee. It was copiously irrigated. The knee was lavaged to remove all chondral and meniscal debris. Arthroscopic instruments were then removed. Then 4-0 nylon was used to close both arthroscopic portals. A dry sterile compressive dressing was applied to the left knee. He was then awakened and brought to the PACU in stable condition. I was scrubbed and present for the entire case. All sharp and instrument counts were correct at the conclusion of the case. I spoke with the patient's wife in the postop consultation room to let her know that the case had gone without complication and that the patient was stable in the recovery room.   ____________________________ Timoteo Gaul, MD klk:sb D: 02/17/2014 16:46:46 ET T: 02/17/2014 17:10:33 ET JOB#: [id number]  cc: Timoteo Gaul, MD, <Dictator> Timoteo Gaul MD ELECTRONICALLY SIGNED 02/18/2014 22:33

## 2015-07-23 ENCOUNTER — Encounter
Admission: RE | Disposition: A | Discharge status: Home / Self Care | Payer: Self-pay | Source: Ambulatory Visit | Attending: Gastroenterology

## 2015-07-23 ENCOUNTER — Ambulatory Visit
Admission: RE | Admit: 2015-07-23 | Discharge: 2015-07-23 | Disposition: A | Discharge status: Home / Self Care | Payer: BLUE CROSS/BLUE SHIELD | Source: Ambulatory Visit | Attending: Gastroenterology | Admitting: Gastroenterology

## 2015-07-23 ENCOUNTER — Ambulatory Visit: Payer: BLUE CROSS/BLUE SHIELD | Admitting: Anesthesiology

## 2015-07-23 ENCOUNTER — Encounter: Payer: Self-pay | Admitting: *Deleted

## 2015-07-23 DIAGNOSIS — K2270 Barrett's esophagus without dysplasia: Secondary | ICD-10-CM | POA: Insufficient documentation

## 2015-07-23 DIAGNOSIS — I10 Essential (primary) hypertension: Secondary | ICD-10-CM | POA: Insufficient documentation

## 2015-07-23 DIAGNOSIS — K2950 Unspecified chronic gastritis without bleeding: Secondary | ICD-10-CM | POA: Diagnosis not present

## 2015-07-23 DIAGNOSIS — Z79899 Other long term (current) drug therapy: Secondary | ICD-10-CM | POA: Diagnosis not present

## 2015-07-23 DIAGNOSIS — Z87891 Personal history of nicotine dependence: Secondary | ICD-10-CM | POA: Diagnosis not present

## 2015-07-23 HISTORY — DX: Diverticulitis of intestine, part unspecified, without perforation or abscess without bleeding: K57.92

## 2015-07-23 HISTORY — DX: Essential (primary) hypertension: I10

## 2015-07-23 HISTORY — PX: ESOPHAGOGASTRODUODENOSCOPY (EGD) WITH PROPOFOL: SHX5813

## 2015-07-23 SURGERY — ESOPHAGOGASTRODUODENOSCOPY (EGD) WITH PROPOFOL
Anesthesia: General

## 2015-07-23 MED ORDER — FENTANYL CITRATE (PF) 100 MCG/2ML IJ SOLN
INTRAMUSCULAR | Status: DC | PRN
  Administered 2015-07-23: 50 ug via INTRAVENOUS

## 2015-07-23 MED ORDER — SODIUM CHLORIDE 0.9 % IV SOLN
INTRAVENOUS | Status: DC
  Administered 2015-07-23: 1000 mL via INTRAVENOUS

## 2015-07-23 MED ORDER — PROPOFOL INFUSION 10 MG/ML OPTIME
INTRAVENOUS | Status: DC | PRN
  Administered 2015-07-23: 120 ug/kg/min via INTRAVENOUS

## 2015-07-23 MED ORDER — PROPOFOL 10 MG/ML IV BOLUS
INTRAVENOUS | Status: DC | PRN
  Administered 2015-07-23: 30 mg via INTRAVENOUS
  Administered 2015-07-23: 20 mg via INTRAVENOUS

## 2015-07-23 MED ORDER — MIDAZOLAM HCL 2 MG/2ML IJ SOLN
INTRAMUSCULAR | Status: DC | PRN
  Administered 2015-07-23: 1 mg via INTRAVENOUS

## 2015-07-23 MED ORDER — SODIUM CHLORIDE 0.9 % IV SOLN: INTRAVENOUS | Status: DC

## 2015-07-23 NOTE — Anesthesia Postprocedure Evaluation (Signed)
  Anesthesia Post-op Note  Patient: Mark Bates  Procedure(s) Performed: Procedure(s): ESOPHAGOGASTRODUODENOSCOPY (EGD) WITH PROPOFOL (N/A)  Anesthesia type:General  Patient location: PACU  Post pain: Pain level controlled  Post assessment: Post-op Vital signs reviewed, Patient's Cardiovascular Status Stable, Respiratory Function Stable, Patent Airway and No signs of Nausea or vomiting  Post vital signs: Reviewed and stable  Last Vitals:  Filed Vitals:   07/23/15 0827  BP: 139/89  Pulse: 76  Temp: 36 C  Resp: 20    Level of consciousness: awake, alert  and patient cooperative  Complications: No apparent anesthesia complications

## 2015-07-23 NOTE — H&P (Signed)
Outpatient short stay form Pre-procedure 07/23/2015 8:54 AM Christena Deem MD  Primary Physician: Dr. Jerl Mina  Reason for visit:  EGD  History of present illness:  Patient is a 49 year old male with a personal history of Barrett's esophagus. He is presenting for serial follow-up. He has been taking DEXA lab daily. He denies any problems with dysphagia or heartburn. He does not take any aspirin or anticoagulation medications.    Current facility-administered medications:  .  0.9 %  sodium chloride infusion, , Intravenous, Continuous, Christena Deem, MD, Last Rate: 20 mL/hr at 07/23/15 0842, 1,000 mL at 07/23/15 0842 .  0.9 %  sodium chloride infusion, , Intravenous, Continuous, Christena Deem, MD  Prescriptions prior to admission  Medication Sig Dispense Refill Last Dose  . amLODipine (NORVASC) 5 MG tablet Take 5 mg by mouth daily.   07/22/2015 at Unknown time  . cetirizine (ZYRTEC) 10 MG tablet Take 10 mg by mouth daily.   07/22/2015 at Unknown time  . dexlansoprazole (DEXILANT) 60 MG capsule Take 60 mg by mouth daily.   07/22/2015 at Unknown time  . fluticasone (FLONASE) 50 MCG/ACT nasal spray Place 2 sprays into both nostrils daily.   07/22/2015 at Unknown time  . hyoscyamine (LEVSIN) 0.125 MG/5ML ELIX Take 0.125 mg by mouth.   07/22/2015 at Unknown time  . Multiple Vitamin (MULTIVITAMIN) tablet Take 1 tablet by mouth daily.   07/22/2015 at Unknown time     No Known Allergies   Past Medical History  Diagnosis Date  . Hypertension   . Diverticulitis     Review of systems:      Physical Exam    Heart and lungs: Regular rate and rhythm without rub or gallop, lungs are bilaterally clear    HEENT: Normocephalic atraumatic eyes are anicteric    Other:     Pertinant exam for procedure: Soft nontender nondistended bowel sounds positive normoactive    Planned proceedures: EGD and indicated procedures I have discussed the risks benefits and complications of  procedures to include not limited to bleeding, infection, perforation and the risk of sedation and the patient wishes to proceed.    Christena Deem, MD Gastroenterology 07/23/2015  8:54 AM

## 2015-07-23 NOTE — Op Note (Signed)
[facility] Gastroenterology Patient Name: Mark Bates Procedure Date: 07/23/2015 8:47 AM MRN: [id number] Account #: [id number] Date of Birth: 1966/10/31 Admit Type: Outpatient Age: 49 Room: [facility] ENDO ROOM 2 Gender: Male Note Status: Finalized Procedure:         Upper GI endoscopy Indications:       Surveillance procedure, Follow-up of Barrett's esophagus Providers:         Christena Deem, MD Referring MD:      Rhona Leavens. Burnett Sheng, MD (Referring MD) Medicines:         Monitored Anesthesia Care Complications:     No immediate complications. Procedure:         Pre-Anesthesia Assessment:                    - ASA Grade Assessment: II - A patient with mild systemic                     disease.                    After obtaining informed consent, the endoscope was passed                     under direct vision. Throughout the procedure, the                     patient's blood pressure, pulse, and oxygen saturations                     were monitored continuously. The Olympus GIF-160 endoscope                     (S#. [id number]) was introduced through the mouth, and                     advanced to the third part of duodenum. The upper GI                     endoscopy was accomplished without difficulty. The patient                     tolerated the procedure well. Findings:      The examined esophagus was normal. Mucosa was biopsied with a cold       forceps for histology in 4 quadrants at the gastroesophageal junction.      The Z-line was regular. No overt evidence of Barretts esophagus.      The stomach was normal.      The examined duodenum was normal.      The cardia and gastric fundus were normal on retroflexion. Impression:        - Normal esophagus. Biopsied.                    - Z-line regular.                    - Normal stomach.                    - Normal examined duodenum. Recommendation:    - Continue present medications.                    - Telephone  GI clinic for pathology results in 1 week. Procedure Code(s): --- Professional ---  16109, Esophagogastroduodenoscopy, flexible, transoral;                     with biopsy, single or multiple Diagnosis Code(s): --- Professional ---                    530.85, Barrett's esophagus CPT copyright 2014 American Medical Association. All rights reserved. The codes documented in this report are preliminary and upon coder review may  be revised to meet current compliance requirements. Christena Deem, MD 07/23/2015 9:23:08 AM This report has been signed electronically. Number of Addenda: 0 Note Initiated On: 07/23/2015 8:47 AM      [facility]

## 2015-07-23 NOTE — Anesthesia Preprocedure Evaluation (Addendum)
Anesthesia Evaluation  Patient identified by MRN, date of birth, ID band Patient awake    Reviewed: Allergy & Precautions, NPO status   History of Anesthesia Complications Negative for: history of anesthetic complications  Airway Mallampati: II       Dental no notable dental hx.    Pulmonary former smoker,    Pulmonary exam normal       Cardiovascular hypertension, negative cardio ROS Normal cardiovascular exam    Neuro/Psych    GI/Hepatic negative GI ROS, Neg liver ROS,   Endo/Other  negative endocrine ROS  Renal/GU negative Renal ROS     Musculoskeletal negative musculoskeletal ROS (+)   Abdominal Normal abdominal exam  (+)   Peds negative pediatric ROS (+)  Hematology negative hematology ROS (+)   Anesthesia Other Findings   Reproductive/Obstetrics negative OB ROS                            Anesthesia Physical Anesthesia Plan  ASA: II  Anesthesia Plan: General   Post-op Pain Management:    Induction: Intravenous  Airway Management Planned: Nasal Cannula  Additional Equipment:   Intra-op Plan:   Post-operative Plan:   Informed Consent: I have reviewed the patients History and Physical, chart, labs and discussed the procedure including the risks, benefits and alternatives for the proposed anesthesia with the patient or authorized representative who has indicated his/her understanding and acceptance.     Plan Discussed with:   Anesthesia Plan Comments:        Anesthesia Quick Evaluation

## 2015-07-23 NOTE — Transfer of Care (Signed)
Immediate Anesthesia Transfer of Care Note  Patient: Mark Bates  Procedure(s) Performed: Procedure(s): ESOPHAGOGASTRODUODENOSCOPY (EGD) WITH PROPOFOL (N/A)  Patient Location: PACU  Anesthesia Type:General  Level of Consciousness: awake  Airway & Oxygen Therapy: Patient Spontanous Breathing  Post-op Assessment: Report given to RN  Post vital signs: stable  Last Vitals:  Filed Vitals:   07/23/15 0827  BP: 139/89  Pulse: 76  Temp: 36 C  Resp: 20    Complications: No apparent anesthesia complications

## 2015-07-26 ENCOUNTER — Encounter: Payer: Self-pay | Admitting: Gastroenterology

## 2015-07-26 LAB — SURGICAL PATHOLOGY

## 2015-07-30 NOTE — Addendum Note (Signed)
Addendum  created 07/30/15 0912 by Lezlie Octave, MD   Modules edited: Anesthesia Attestations

## 2015-08-20 ENCOUNTER — Emergency Department
Admission: EM | Admit: 2015-08-20 | Discharge: 2015-08-20 | Disposition: A | Discharge status: Home / Self Care | Payer: BLUE CROSS/BLUE SHIELD | Attending: Emergency Medicine | Admitting: Emergency Medicine

## 2015-08-20 DIAGNOSIS — I10 Essential (primary) hypertension: Secondary | ICD-10-CM | POA: Diagnosis not present

## 2015-08-20 DIAGNOSIS — Z79899 Other long term (current) drug therapy: Secondary | ICD-10-CM | POA: Insufficient documentation

## 2015-08-20 DIAGNOSIS — R55 Syncope and collapse: Secondary | ICD-10-CM | POA: Diagnosis present

## 2015-08-20 DIAGNOSIS — R079 Chest pain, unspecified: Secondary | ICD-10-CM | POA: Insufficient documentation

## 2015-08-20 LAB — CBC
HCT: 41.5 % (ref 40.0–52.0)
Hemoglobin: 14.2 g/dL (ref 13.0–18.0)
MCH: 31.1 pg (ref 26.0–34.0)
MCHC: 34.1 g/dL (ref 32.0–36.0)
MCV: 91.2 fL (ref 80.0–100.0)
PLATELETS: 154 10*3/uL (ref 150–440)
RBC: 4.55 MIL/uL (ref 4.40–5.90)
RDW: 12.6 % (ref 11.5–14.5)
WBC: 8 10*3/uL (ref 3.8–10.6)

## 2015-08-20 LAB — BASIC METABOLIC PANEL
Anion gap: 6 (ref 5–15)
BUN: 16 mg/dL (ref 6–20)
CALCIUM: 9.1 mg/dL (ref 8.9–10.3)
CO2: 26 mmol/L (ref 22–32)
CREATININE: 1.02 mg/dL (ref 0.61–1.24)
Chloride: 109 mmol/L (ref 101–111)
Glucose, Bld: 147 mg/dL — ABNORMAL HIGH (ref 65–99)
Potassium: 3.7 mmol/L (ref 3.5–5.1)
SODIUM: 141 mmol/L (ref 135–145)

## 2015-08-20 LAB — TROPONIN I

## 2015-08-20 NOTE — Discharge Instructions (Signed)
Please seek medical attention for any high fevers, chest pain, shortness of breath, change in behavior, persistent vomiting, bloody stool or any other new or concerning symptoms. ° °Syncope °Syncope is a medical term for fainting or passing out. This means you lose consciousness and drop to the ground. People are generally unconscious for less than 5 minutes. You may have some muscle twitches for up to 15 seconds before waking up and returning to normal. Syncope occurs more often in older adults, but it can happen to anyone. While most causes of syncope are not dangerous, syncope can be a sign of a serious medical problem. It is important to seek medical care.  °CAUSES  °Syncope is caused by a sudden drop in blood flow to the brain. The specific cause is often not determined. Factors that can bring on syncope include: °· Taking medicines that lower blood pressure. °· Sudden changes in posture, such as standing up quickly. °· Taking more medicine than prescribed. °· Standing in one place for too long. °· Seizure disorders. °· Dehydration and excessive exposure to heat. °· Low blood sugar (hypoglycemia). °· Straining to have a bowel movement. °· Heart disease, irregular heartbeat, or other circulatory problems. °· Fear, emotional distress, seeing blood, or severe pain. °SYMPTOMS  °Right before fainting, you may: °· Feel dizzy or light-headed. °· Feel nauseous. °· See all white or all black in your field of vision. °· Have cold, clammy skin. °DIAGNOSIS  °Your health care provider will ask about your symptoms, perform a physical exam, and perform an electrocardiogram (ECG) to record the electrical activity of your heart. Your health care provider may also perform other heart or blood tests to determine the cause of your syncope which may include: °· Transthoracic echocardiogram (TTE). During echocardiography, sound waves are used to evaluate how blood flows through your heart. °· Transesophageal echocardiogram  (TEE). °· Cardiac monitoring. This allows your health care provider to monitor your heart rate and rhythm in real time. °· Holter monitor. This is a portable device that records your heartbeat and can help diagnose heart arrhythmias. It allows your health care provider to track your heart activity for several days, if needed. °· Stress tests by exercise or by giving medicine that makes the heart beat faster. °TREATMENT  °In most cases, no treatment is needed. Depending on the cause of your syncope, your health care provider may recommend changing or stopping some of your medicines. °HOME CARE INSTRUCTIONS °· Have someone stay with you until you feel stable. °· Do not drive, use machinery, or play sports until your health care provider says it is okay. °· Keep all follow-up appointments as directed by your health care provider. °· Lie down right away if you start feeling like you might faint. Breathe deeply and steadily. Wait until all the symptoms have passed. °· Drink enough fluids to keep your urine clear or pale yellow. °· If you are taking blood pressure or heart medicine, get up slowly and take several minutes to sit and then stand. This can reduce dizziness. °SEEK IMMEDIATE MEDICAL CARE IF:  °· You have a severe headache. °· You have unusual pain in the chest, abdomen, or back. °· You are bleeding from your mouth or rectum, or you have black or tarry stool. °· You have an irregular or very fast heartbeat. °· You have pain with breathing. °· You have repeated fainting or seizure-like jerking during an episode. °· You faint when sitting or lying down. °· You have confusion. °·   You have trouble walking. °· You have severe weakness. °· You have vision problems. °If you fainted, call your local emergency services (911 in [place] Do not drive yourself to the hospital.  °MAKE SURE YOU: °· Understand these instructions. °· Will watch your condition. °· Will get help right away if you are not doing well or get  worse. °Document Released: 11/20/2005 Document Revised: 11/25/2013 Document Reviewed: 01/19/2012 °ExitCare® Patient Information ©2015 [employer]. This information is not intended to replace advice given to you by your health care provider. Make sure you discuss any questions you have with your health care provider. ° °
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
## 2015-09-08 ENCOUNTER — Other Ambulatory Visit: Payer: Self-pay | Admitting: Neurology

## 2015-09-08 DIAGNOSIS — H53483 Generalized contraction of visual field, bilateral: Secondary | ICD-10-CM

## 2015-09-08 DIAGNOSIS — R55 Syncope and collapse: Secondary | ICD-10-CM

## 2015-09-21 ENCOUNTER — Ambulatory Visit
Admission: RE | Admit: 2015-09-21 | Discharge: 2015-09-21 | Disposition: A | Discharge status: Home / Self Care | Payer: BLUE CROSS/BLUE SHIELD | Source: Ambulatory Visit | Attending: Neurology | Admitting: Neurology

## 2015-09-21 DIAGNOSIS — H53483 Generalized contraction of visual field, bilateral: Secondary | ICD-10-CM | POA: Insufficient documentation

## 2015-09-21 DIAGNOSIS — R55 Syncope and collapse: Secondary | ICD-10-CM | POA: Insufficient documentation

## 2015-09-21 MED ORDER — GADOBENATE DIMEGLUMINE 529 MG/ML IV SOLN
20.0000 mL | Freq: Once | INTRAVENOUS | Status: AC | PRN
  Administered 2015-09-21: 20 mL via INTRAVENOUS

## 2016-09-28 ENCOUNTER — Other Ambulatory Visit: Payer: Self-pay | Admitting: Gastroenterology

## 2016-09-28 DIAGNOSIS — K449 Diaphragmatic hernia without obstruction or gangrene: Secondary | ICD-10-CM

## 2016-09-28 DIAGNOSIS — R079 Chest pain, unspecified: Secondary | ICD-10-CM

## 2016-09-29 ENCOUNTER — Ambulatory Visit
Admission: RE | Admit: 2016-09-29 | Discharge: 2016-09-29 | Disposition: A | Discharge status: Home / Self Care | Payer: BLUE CROSS/BLUE SHIELD | Source: Ambulatory Visit | Attending: Gastroenterology | Admitting: Gastroenterology

## 2016-09-29 DIAGNOSIS — R079 Chest pain, unspecified: Secondary | ICD-10-CM | POA: Diagnosis not present

## 2016-09-29 DIAGNOSIS — K449 Diaphragmatic hernia without obstruction or gangrene: Secondary | ICD-10-CM | POA: Insufficient documentation

## 2016-09-29 DIAGNOSIS — K219 Gastro-esophageal reflux disease without esophagitis: Secondary | ICD-10-CM | POA: Diagnosis not present

## 2017-08-06 ENCOUNTER — Encounter: Payer: Self-pay | Admitting: Medical

## 2017-08-06 ENCOUNTER — Ambulatory Visit: Payer: Self-pay | Admitting: Medical

## 2017-08-06 ENCOUNTER — Encounter (INDEPENDENT_AMBULATORY_CARE_PROVIDER_SITE_OTHER): Payer: Self-pay

## 2017-08-06 VITALS — BP 120/84 | HR 72 | Temp 98.4°F | Resp 18 | Ht 75.0 in | Wt 277.0 lb

## 2017-08-06 DIAGNOSIS — M2390 Unspecified internal derangement of unspecified knee: Secondary | ICD-10-CM | POA: Insufficient documentation

## 2017-08-06 DIAGNOSIS — R269 Unspecified abnormalities of gait and mobility: Secondary | ICD-10-CM | POA: Insufficient documentation

## 2017-08-06 DIAGNOSIS — K219 Gastro-esophageal reflux disease without esophagitis: Secondary | ICD-10-CM | POA: Insufficient documentation

## 2017-08-06 DIAGNOSIS — M7500 Adhesive capsulitis of unspecified shoulder: Secondary | ICD-10-CM | POA: Insufficient documentation

## 2017-08-06 DIAGNOSIS — L259 Unspecified contact dermatitis, unspecified cause: Secondary | ICD-10-CM

## 2017-08-06 DIAGNOSIS — M7520 Bicipital tendinitis, unspecified shoulder: Secondary | ICD-10-CM | POA: Insufficient documentation

## 2017-08-06 DIAGNOSIS — M7540 Impingement syndrome of unspecified shoulder: Secondary | ICD-10-CM | POA: Insufficient documentation

## 2017-08-06 MED ORDER — PREDNISONE 10 MG (21) PO TBPK: ORAL_TABLET | ORAL | 0 refills | Status: AC

## 2017-08-06 NOTE — Patient Instructions (Signed)

## 2017-08-06 NOTE — Progress Notes (Signed)
   Subjective:    Patient ID: Mark Bates, male    DOB: 1966-11-04, 51 y.o.   MRN: [id number]  HPI 51 yo male  started with rash started last Wednesday, mildy itchy , no pain. On bilateral ankles and upper thighs and one or two spots on his  Left flank side.Marland Kitchen.  His work has opened up a new walking trail sos he had started walking.Tried tea tree oil and peroxide. Seemed to have blistered up with clear fluid on a few of the  spots.  Presents with wife today.   Review of Systems  Constitutional: Positive for activity change. Negative for chills, fatigue and fever.  HENT: Positive for congestion. Negative for ear pain and sore throat.   Eyes: Negative for discharge and itching.  Respiratory: Negative for cough and shortness of breath.   Cardiovascular: Negative for chest pain.  Gastrointestinal: Negative for abdominal pain.  Endocrine: Negative for polydipsia, polyphagia and polyuria.  Genitourinary: Negative for dysuria.  Musculoskeletal: Negative for back pain and myalgias.  Skin: Positive for rash.  Allergic/Immunologic: Positive for environmental allergies. Negative for food allergies.  Neurological: Negative for dizziness, syncope and light-headedness.  Hematological: Negative for adenopathy.  Psychiatric/Behavioral: Negative for behavioral problems and hallucinations.   Activity more is walking outside more.    Objective:   Physical Exam  Constitutional: He is oriented to person, place, and time. He appears well-developed and well-nourished.  Eyes: Pupils are equal, round, and reactive to light. Conjunctivae and EOM are normal.  Neurological: He is alert and oriented to person, place, and time.  Skin: Skin is warm and dry. Rash noted. There is erythema.  Psychiatric: He has a normal mood and affect. His behavior is normal. Judgment and thought content normal.  Nursing note and vitals reviewed.   Maculopapular tiny erythematous rash few sporatic areas on ankles and upper thighs  with excoriated areas.     Assessment & Plan:  Contact Dermatitis   Benadryl  25 mg at nighttime, stay on Zyrtec during the day. Meds ordered this encounter  Medications  . dicyclomine (BENTYL) 10 MG capsule    Sig: Take by mouth.  . predniSONE (STERAPRED UNI-PAK 21 TAB) 10 MG (21) TBPK tablet    Sig: Take  6 tablets by mouth  Today and 5 tablets tomorrow then one tablet  less each day there after, take with food.    Dispense:  21 tablet    Refill:  0  return in 3-5 days if not improving.  Cool /lukewarm showers. Bentyl was not ordered during this encounter, but added to patients list of medications.

## 2017-09-28 ENCOUNTER — Ambulatory Visit: Payer: Self-pay

## 2017-09-28 DIAGNOSIS — Z23 Encounter for immunization: Secondary | ICD-10-CM

## 2018-12-26 ENCOUNTER — Encounter: Payer: Self-pay | Admitting: *Deleted

## 2018-12-27 ENCOUNTER — Ambulatory Visit
Admission: RE | Admit: 2018-12-27 | Discharge: 2018-12-27 | Disposition: A | Discharge status: Home / Self Care | Payer: BLUE CROSS/BLUE SHIELD | Attending: Gastroenterology | Admitting: Gastroenterology

## 2018-12-27 ENCOUNTER — Encounter
Admission: RE | Disposition: A | Discharge status: Home / Self Care | Payer: Self-pay | Source: Home / Self Care | Attending: Gastroenterology

## 2018-12-27 ENCOUNTER — Ambulatory Visit: Payer: BLUE CROSS/BLUE SHIELD | Admitting: Anesthesiology

## 2018-12-27 ENCOUNTER — Encounter: Payer: Self-pay | Admitting: *Deleted

## 2018-12-27 DIAGNOSIS — K2270 Barrett's esophagus without dysplasia: Secondary | ICD-10-CM | POA: Insufficient documentation

## 2018-12-27 DIAGNOSIS — K449 Diaphragmatic hernia without obstruction or gangrene: Secondary | ICD-10-CM | POA: Insufficient documentation

## 2018-12-27 DIAGNOSIS — K219 Gastro-esophageal reflux disease without esophagitis: Secondary | ICD-10-CM | POA: Insufficient documentation

## 2018-12-27 DIAGNOSIS — I10 Essential (primary) hypertension: Secondary | ICD-10-CM | POA: Diagnosis not present

## 2018-12-27 DIAGNOSIS — K589 Irritable bowel syndrome without diarrhea: Secondary | ICD-10-CM | POA: Insufficient documentation

## 2018-12-27 DIAGNOSIS — Z791 Long term (current) use of non-steroidal anti-inflammatories (NSAID): Secondary | ICD-10-CM | POA: Insufficient documentation

## 2018-12-27 DIAGNOSIS — K317 Polyp of stomach and duodenum: Secondary | ICD-10-CM | POA: Insufficient documentation

## 2018-12-27 DIAGNOSIS — K228 Other specified diseases of esophagus: Secondary | ICD-10-CM | POA: Insufficient documentation

## 2018-12-27 DIAGNOSIS — Z79899 Other long term (current) drug therapy: Secondary | ICD-10-CM | POA: Insufficient documentation

## 2018-12-27 DIAGNOSIS — Z87891 Personal history of nicotine dependence: Secondary | ICD-10-CM | POA: Diagnosis not present

## 2018-12-27 DIAGNOSIS — Z7951 Long term (current) use of inhaled steroids: Secondary | ICD-10-CM | POA: Insufficient documentation

## 2018-12-27 HISTORY — DX: Gastritis, unspecified, without bleeding: K29.70

## 2018-12-27 HISTORY — DX: Disease of esophagus, unspecified: K22.9

## 2018-12-27 HISTORY — PX: ESOPHAGOGASTRODUODENOSCOPY (EGD) WITH PROPOFOL: SHX5813

## 2018-12-27 HISTORY — DX: Depression, unspecified: F32.A

## 2018-12-27 HISTORY — DX: Other diseases of stomach and duodenum: K31.89

## 2018-12-27 HISTORY — DX: Irritable bowel syndrome without diarrhea: K58.9

## 2018-12-27 HISTORY — DX: Gastro-esophageal reflux disease without esophagitis: K21.9

## 2018-12-27 HISTORY — DX: Dyskinesia of esophagus: K22.4

## 2018-12-27 HISTORY — DX: Major depressive disorder, single episode, unspecified: F32.9

## 2018-12-27 HISTORY — DX: Gastric intestinal metaplasia, unspecified: K31.A0

## 2018-12-27 SURGERY — ESOPHAGOGASTRODUODENOSCOPY (EGD) WITH PROPOFOL
Anesthesia: General

## 2018-12-27 MED ORDER — SODIUM CHLORIDE 0.9 % IV SOLN
INTRAVENOUS | Status: DC
  Administered 2018-12-27: 1000 mL via INTRAVENOUS

## 2018-12-27 MED ORDER — PROPOFOL 10 MG/ML IV BOLUS
INTRAVENOUS | Status: DC | PRN
  Administered 2018-12-27: 100 mg via INTRAVENOUS
  Administered 2018-12-27: 40 mg via INTRAVENOUS

## 2018-12-27 MED ORDER — PROPOFOL 500 MG/50ML IV EMUL
INTRAVENOUS | Status: DC | PRN
  Administered 2018-12-27: 160 ug/kg/min via INTRAVENOUS

## 2018-12-27 NOTE — Anesthesia Post-op Follow-up Note (Signed)
Anesthesia QCDR form completed.        

## 2018-12-27 NOTE — Anesthesia Procedure Notes (Signed)
Date/Time: 12/27/2018 1:54 PM Performed by: Junious Silk, CRNA Pre-anesthesia Checklist: Patient identified, Emergency Drugs available, Suction available, Patient being monitored and Timeout performed Oxygen Delivery Method: Nasal cannula

## 2018-12-27 NOTE — H&P (Signed)
Outpatient short stay form Pre-procedure 12/27/2018 1:43 PM Christena Deem MD  Primary Physician: Dr. Jerl Mina  Reason for visit: EGD  History of present illness: Patient is a 53 year old male presenting today for an EGD in regards to his personal history of Barrett's esophagus.  His last procedure was about 3 years ago.  He takes no aspirin or blood thinning agent.  He has been taking Dexilant 60 mg a day and his reflux is well controlled he has no difficulty swallowing.    Current Facility-Administered Medications:  .  0.9 %  sodium chloride infusion, , Intravenous, Continuous, Christena Deem, MD, Last Rate: 20 mL/hr at 12/27/18 1308, 1,000 mL at 12/27/18 1308  Medications Prior to Admission  Medication Sig Dispense Refill Last Dose  . amLODipine (NORVASC) 5 MG tablet Take 5 mg by mouth at bedtime.    12/26/2018 at Unknown time  . cetirizine (ZYRTEC) 10 MG tablet Take 10 mg by mouth at bedtime.    Past Month at Unknown time  . dexlansoprazole (DEXILANT) 60 MG capsule Take 60 mg by mouth daily.   12/26/2018 at Unknown time  . fluticasone (FLONASE) 50 MCG/ACT nasal spray Place 2 sprays into both nostrils daily as needed for rhinitis.    Past Month at Unknown time  . hyoscyamine (LEVSIN) 0.125 MG/5ML ELIX Take 0.125 mg by mouth every 4 (four) hours as needed.     . ibandronate (BONIVA) 150 MG tablet Take 150 mg by mouth every 30 (thirty) days. Take in the morning with a full glass of water, on an empty stomach, and do not take anything else by mouth or lie down for the next 30 min.     . meloxicam (MOBIC) 7.5 MG tablet Take 7.5 mg by mouth daily.     . Multiple Vitamin (MULTIVITAMIN WITH MINERALS) TABS tablet Take 1 tablet by mouth daily.   Past Week at Unknown time  . Probiotic Product (PHILLIPS COLON HEALTH) CAPS Take 1 capsule by mouth daily.   12/26/2018 at Unknown time  . Wheat Dextrin (BENEFIBER DRINK MIX PO) Take 1 packet by mouth daily.   Past Week at Unknown time  .  dicyclomine (BENTYL) 10 MG capsule Take by mouth.   Taking  . OVER THE COUNTER MEDICATION Take 1 capsule by mouth daily. Pt takes IBgard.   Taking  . predniSONE (STERAPRED UNI-PAK 21 TAB) 10 MG (21) TBPK tablet Take  6 tablets by mouth  Today and 5 tablets tomorrow then one tablet  less each day there after, take with food. 21 tablet 0      No Known Allergies   Past Medical History:  Diagnosis Date  . Depression   . Diverticulitis   . Esophageal motility disorder   . Gastritis   . GERD (gastroesophageal reflux disease)   . Hypertension   . Intestinal metaplasia of gastric mucosa   . Irregular Z line of esophagus   . Irritable bowel syndrome (IBS)     Review of systems:      Physical Exam    Heart and lungs: Regular rate and rhythm without rub or gallop, lungs are bilaterally clear.    HEENT: Normocephalic atraumatic eyes are anicteric    Other:    Pertinant exam for procedure: Soft nontender nondistended bowel sounds positive normoactive    Planned proceedures: [place] procedures. I have discussed the risks benefits and complications of procedures to include not limited to bleeding, infection, perforation and the risk of sedation and the patient  wishes to proceed.    Christena DeemMartin U , MD Gastroenterology 12/27/2018  1:43 PM

## 2018-12-27 NOTE — Transfer of Care (Signed)
Immediate Anesthesia Transfer of Care Note  Patient: Mark Bates  Procedure(s) Performed: ESOPHAGOGASTRODUODENOSCOPY (EGD) WITH PROPOFOL (N/A )  Patient Location: PACU  Anesthesia Type:General  Level of Consciousness: sedated  Airway & Oxygen Therapy: Patient Spontanous Breathing and Patient connected to nasal cannula oxygen  Post-op Assessment: Report given to RN and Post -op Vital signs reviewed and stable  Post vital signs: Reviewed and stable  Last Vitals:  Vitals Value Taken Time  BP 121/81 12/27/2018  2:04 PM  Temp 36.7 C 12/27/2018  2:04 PM  Pulse 90 12/27/2018  2:05 PM  Resp 17 12/27/2018  2:05 PM  SpO2 97 % 12/27/2018  2:05 PM  Vitals shown include unvalidated device data.  Last Pain:  Vitals:   12/27/18 1404  TempSrc: Tympanic  PainSc:       Patients Stated Pain Goal: 0 (12/27/18 1249)  Complications: No apparent anesthesia complications

## 2018-12-27 NOTE — Op Note (Signed)
[facility] Gastroenterology Patient Name: Mark Bates Procedure Date: 12/27/2018 1:37 PM MRN: [id number] Account #: [id number] Date of Birth: Jul 19, 1966 Admit Type: Outpatient Age: 53 [facility] 1 Gender: Male Note Status: Finalized Procedure:            Upper GI endoscopy Indications:          Follow-up of Barrett's esophagus Providers:            Christena DeemMartin U. Skulskie, MD Medicines:            Monitored Anesthesia Care Complications:        No immediate complications. Procedure:            Pre-Anesthesia Assessment:                       - ASA Grade Assessment: II - A patient with mild                        systemic disease.                       After obtaining informed consent, the endoscope was                        passed under direct vision. Throughout the procedure,                        the patient's blood pressure, pulse, and oxygen                        saturations were monitored continuously. The Endoscope                        was introduced through the mouth, and advanced to the                        third part of duodenum. The upper GI endoscopy was                        accomplished without difficulty. The patient tolerated                        the procedure well. Findings:      A small hiatal hernia was found. The Z-line was a variable distance from       incisors; the hiatal hernia was sliding.      The Z-line was variable, minimal evidence of Barretts above the z line.       Biopsies were taken with a cold forceps for histology in a       targeted/quadrant fashion.      A single 3 mm sessile polyp with no bleeding and mildly inflamed was       found in the gastric body. The polyp was removed with a cold biopsy       forceps. Resection and retrieval were complete.      The cardia and gastric fundus were normal on retroflexion.      The examined duodenum was normal. Impression:           - Small hiatal hernia.       - Z-line variable. Biopsied.                       -  A single gastric polyp. Resected and retrieved.                       - Normal examined duodenum. Recommendation:       - Continue present medications.                       - Telephone GI clinic for pathology results in 1 week. Procedure Code(s):    --- Professional ---                       [phone], Esophagogastroduodenoscopy, flexible, transoral;                        with biopsy, single or multiple Diagnosis Code(s):    --- Professional ---                       K44.9, Diaphragmatic hernia without obstruction or                        gangrene                       K22.8, Other specified diseases of esophagus                       K31.7, Polyp of stomach and duodenum                       K22.70, Barrett's esophagus without dysplasia CPT copyright 2018 American Medical Association. All rights reserved. The codes documented in this report are preliminary and upon coder review may  be revised to meet current compliance requirements. Christena Deem, MD 12/27/2018 2:04:08 PM This report has been signed electronically. Number of Addenda: 0 Note Initiated On: 12/27/2018 1:37 PM      [facility]

## 2018-12-27 NOTE — Anesthesia Postprocedure Evaluation (Signed)
Anesthesia Post Note  Patient: Mark Bates  Procedure(s) Performed: ESOPHAGOGASTRODUODENOSCOPY (EGD) WITH PROPOFOL (N/A )  Patient location during evaluation: Endoscopy Anesthesia Type: General Level of consciousness: awake and alert and oriented Pain management: pain level controlled Vital Signs Assessment: post-procedure vital signs reviewed and stable Respiratory status: spontaneous breathing, nonlabored ventilation and respiratory function stable Cardiovascular status: blood pressure returned to baseline and stable Postop Assessment: no signs of nausea or vomiting Anesthetic complications: no     Last Vitals:  Vitals:   12/27/18 1249 12/27/18 1404  BP: (!) 146/101   Pulse: (!) 106   Resp: 20   Temp: 36.9 C 36.7 C  SpO2: 97%     Last Pain:  Vitals:   12/27/18 1404  TempSrc: Tympanic  PainSc:                  [place]

## 2018-12-27 NOTE — Anesthesia Preprocedure Evaluation (Signed)
Anesthesia Evaluation  Patient identified by MRN, date of birth, ID band Patient awake    Reviewed: Allergy & Precautions, NPO status , Patient's Chart, lab work & pertinent test results  History of Anesthesia Complications Negative for: history of anesthetic complications  Airway Mallampati: II  TM Distance: >3 FB Neck ROM: Full    Dental no notable dental hx.    Pulmonary neg sleep apnea, neg COPD, former smoker,    breath sounds clear to auscultation- rhonchi (-) wheezing      Cardiovascular Exercise Tolerance: Good hypertension, Pt. on medications (-) CAD, (-) Past MI, (-) Cardiac Stents and (-) CABG  Rhythm:Regular Rate:Normal - Systolic murmurs and - Diastolic murmurs    Neuro/Psych neg Seizures PSYCHIATRIC DISORDERS Depression negative neurological ROS     GI/Hepatic Neg liver ROS, GERD  ,  Endo/Other  negative endocrine ROSneg diabetes  Renal/GU negative Renal ROS     Musculoskeletal negative musculoskeletal ROS (+)   Abdominal (+) + obese,   Peds  Hematology negative hematology ROS (+)   Anesthesia Other Findings Past Medical History: No date: Depression No date: Diverticulitis No date: Esophageal motility disorder No date: Gastritis No date: GERD (gastroesophageal reflux disease) No date: Hypertension No date: Intestinal metaplasia of gastric mucosa No date: Irregular Z line of esophagus No date: Irritable bowel syndrome (IBS)   Reproductive/Obstetrics                             Anesthesia Physical Anesthesia Plan  ASA: II  Anesthesia Plan: General   Post-op Pain Management:    Induction: Intravenous  PONV Risk Score and Plan: 1 and Propofol infusion  Airway Management Planned: Natural Airway  Additional Equipment:   Intra-op Plan:   Post-operative Plan:   Informed Consent: I have reviewed the patients History and Physical, chart, labs and discussed the  procedure including the risks, benefits and alternatives for the proposed anesthesia with the patient or authorized representative who has indicated his/her understanding and acceptance.     Dental advisory given  Plan Discussed with: CRNA and Anesthesiologist  Anesthesia Plan Comments:         Anesthesia Quick Evaluation

## 2018-12-30 ENCOUNTER — Encounter: Payer: Self-pay | Admitting: Gastroenterology

## 2018-12-31 LAB — SURGICAL PATHOLOGY

## 2019-01-01 ENCOUNTER — Encounter: Admission: RE | Payer: Self-pay | Source: Ambulatory Visit

## 2019-01-01 ENCOUNTER — Ambulatory Visit
Admission: RE | Admit: 2019-01-01 | Payer: BLUE CROSS/BLUE SHIELD | Source: Ambulatory Visit | Admitting: Gastroenterology

## 2019-01-01 SURGERY — ESOPHAGOGASTRODUODENOSCOPY (EGD) WITH PROPOFOL
Anesthesia: General

## 2019-09-01 ENCOUNTER — Ambulatory Visit: Payer: Self-pay

## 2019-09-01 ENCOUNTER — Other Ambulatory Visit: Payer: Self-pay

## 2019-09-01 VITALS — BP 164/102 | HR 68 | Resp 16 | Ht 75.0 in | Wt 280.2 lb

## 2019-09-01 DIAGNOSIS — Z008 Encounter for other general examination: Secondary | ICD-10-CM

## 2019-09-01 LAB — POCT LIPID PANEL
HDL: 40
LDL: 59
Non-HDL: 78
POC Glucose: 135 mg/dl — AB (ref 70–99)
TC/HDL: 2.9
TC: 118
TRG: 94

## 2019-09-01 NOTE — Progress Notes (Signed)
   Subjective:    Patient ID: Mark Bates, male    DOB: October 02, 1966, 53 y.o.   MRN: [id number]  Patient presented to clinic for biometric screening provided in the workplace. Patient noted to be hypertensive and states on 08/31/2019 he felt "off" and somewhat sluggish but no fever or other symptoms noted. Notified PCP for follow up at this time.     Review of Systems     Objective:   Physical Exam  Assessment & Plan:   Wt Readings from Last 3 Encounters:  09/01/19 280 lb 3.2 oz (127.1 kg)  12/27/18 275 lb (124.7 kg)  08/06/17 277 lb (125.6 kg)   Temp Readings from Last 3 Encounters:  12/27/18 98 F (36.7 C) (Tympanic)  08/06/17 98.4 F (36.9 C) (Tympanic)  08/20/15 98.3 F (36.8 C) (Oral)   BP Readings from Last 3 Encounters:  09/01/19 (!) 168/100  12/27/18 (!) 141/100  08/06/17 120/84   Pulse Readings from Last 3 Encounters:  09/01/19 74  12/27/18 (!) 106  08/06/17 72     No results found for: POCGLU         Thank you!!  [place] Nurse Specialist [place]: [phone]  Cell:  [phone] Website: [email or web address]

## 2019-09-02 ENCOUNTER — Ambulatory Visit: Payer: Self-pay

## 2019-09-02 VITALS — BP 140/96 | HR 98 | Temp 99.1°F | Resp 18

## 2019-09-02 DIAGNOSIS — Z0130 Encounter for examination of blood pressure without abnormal findings: Secondary | ICD-10-CM

## 2019-09-02 NOTE — Progress Notes (Signed)
   Subjective:    Patient ID: Mark Bates, male    DOB: 30-Apr-1966, 53 y.o.   MRN: [id number]  Patient presents to clinic for BP check d/t increasing BP medication on 09/01/19.     Review of Systems     Objective:   Physical Exam  Assessment & Plan:   Wt Readings from Last 3 Encounters:  09/01/19 280 lb 3.2 oz (127.1 kg)  12/27/18 275 lb (124.7 kg)  08/06/17 277 lb (125.6 kg)   Temp Readings from Last 3 Encounters:  09/02/19 99.1 F (37.3 C) (Oral)  12/27/18 98 F (36.7 C) (Tympanic)  08/06/17 98.4 F (36.9 C) (Tympanic)   BP Readings from Last 3 Encounters:  09/02/19 (!) 140/96  09/01/19 (!) 164/102  12/27/18 (!) 141/100   Pulse Readings from Last 3 Encounters:  09/02/19 98  09/01/19 68  12/27/18 (!) 106     Lab Results  Component Value Date   POCGLU 135 (A) 09/01/2019           Thank you!!  Apolonio Schneiders RN  [place] Nurse Specialist [place]: [phone]  Cell:  [phone] Website: [email or web address]

## 2019-09-03 ENCOUNTER — Ambulatory Visit: Payer: Self-pay

## 2019-09-03 ENCOUNTER — Other Ambulatory Visit: Payer: Self-pay

## 2019-09-03 VITALS — BP 144/82 | HR 83 | Temp 97.7°F | Resp 17

## 2019-09-03 DIAGNOSIS — Z0130 Encounter for examination of blood pressure without abnormal findings: Secondary | ICD-10-CM

## 2019-09-03 NOTE — Progress Notes (Signed)
   Subjective:    Patient ID: Mark Bates, male    DOB: July 20, 1966, 53 y.o.   MRN: [id number]  HPI    Review of Systems     Objective:   Physical Exam  Assessment & Plan:   Wt Readings from Last 3 Encounters:  09/01/19 280 lb 3.2 oz (127.1 kg)  12/27/18 275 lb (124.7 kg)  08/06/17 277 lb (125.6 kg)   Temp Readings from Last 3 Encounters:  09/03/19 97.7 F (36.5 C) (Temporal)  09/02/19 99.1 F (37.3 C) (Oral)  12/27/18 98 F (36.7 C) (Tympanic)   BP Readings from Last 3 Encounters:  09/03/19 (!) 144/82  09/02/19 (!) 140/96  09/01/19 (!) 164/102   Pulse Readings from Last 3 Encounters:  09/03/19 83  09/02/19 98  09/01/19 68     Lab Results  Component Value Date   POCGLU 135 (A) 09/01/2019           Thank you!!  Apolonio Schneiders RN  [place] Nurse Specialist [place]: [phone]  Cell:  [phone] Website: [email or web address]

## 2019-09-04 ENCOUNTER — Ambulatory Visit: Payer: Self-pay

## 2019-09-04 VITALS — BP 150/96 | HR 75 | Resp 17

## 2019-09-04 DIAGNOSIS — Z0130 Encounter for examination of blood pressure without abnormal findings: Secondary | ICD-10-CM

## 2019-09-04 NOTE — Progress Notes (Signed)
   Subjective:    Patient ID: Mark Bates, male    DOB: February 05, 1966, 53 y.o.   MRN: [id number]  HPI    Review of Systems     Objective:   Physical Exam  Assessment & Plan:   Wt Readings from Last 3 Encounters:  09/01/19 280 lb 3.2 oz (127.1 kg)  12/27/18 275 lb (124.7 kg)  08/06/17 277 lb (125.6 kg)   Temp Readings from Last 3 Encounters:  09/03/19 97.7 F (36.5 C) (Temporal)  09/02/19 99.1 F (37.3 C) (Oral)  12/27/18 98 F (36.7 C) (Tympanic)   BP Readings from Last 3 Encounters:  09/04/19 (!) 150/96  09/03/19 (!) 144/82  09/02/19 (!) 140/96   Pulse Readings from Last 3 Encounters:  09/04/19 75  09/03/19 83  09/02/19 98     Lab Results  Component Value Date   POCGLU 135 (A) 09/01/2019           Thank you!!  Apolonio Schneiders RN  [place] Nurse Specialist [place]: [phone]  Cell:  [phone] Website: [email or web address]

## 2019-09-08 ENCOUNTER — Ambulatory Visit: Payer: Self-pay

## 2019-09-08 ENCOUNTER — Other Ambulatory Visit: Payer: Self-pay

## 2019-09-08 VITALS — BP 130/90 | HR 94 | Resp 16

## 2019-09-08 DIAGNOSIS — Z0130 Encounter for examination of blood pressure without abnormal findings: Secondary | ICD-10-CM

## 2019-09-08 NOTE — Progress Notes (Signed)
   Subjective:    Patient ID: Mark Bates, male    DOB: 1966-06-05, 53 y.o.   MRN: [id number]  HPI    Review of Systems     Objective:   Physical Exam  Assessment & Plan:   Wt Readings from Last 3 Encounters:  09/01/19 280 lb 3.2 oz (127.1 kg)  12/27/18 275 lb (124.7 kg)  08/06/17 277 lb (125.6 kg)   Temp Readings from Last 3 Encounters:  09/03/19 97.7 F (36.5 C) (Temporal)  09/02/19 99.1 F (37.3 C) (Oral)  12/27/18 98 F (36.7 C) (Tympanic)   BP Readings from Last 3 Encounters:  09/08/19 130/90  09/04/19 (!) 150/96  09/03/19 (!) 144/82   Pulse Readings from Last 3 Encounters:  09/08/19 94  09/04/19 75  09/03/19 83     Lab Results  Component Value Date   POCGLU 135 (A) 09/01/2019           Thank you!!  Apolonio Schneiders RN  [place] Nurse Specialist [place]: [phone]  Cell:  [phone] Website: [email or web address]

## 2019-09-10 ENCOUNTER — Other Ambulatory Visit: Payer: Self-pay

## 2019-09-10 ENCOUNTER — Ambulatory Visit: Payer: Self-pay

## 2019-09-10 DIAGNOSIS — Z23 Encounter for immunization: Secondary | ICD-10-CM

## 2019-09-11 ENCOUNTER — Other Ambulatory Visit: Payer: Self-pay

## 2019-09-11 ENCOUNTER — Ambulatory Visit: Payer: Self-pay

## 2019-09-11 VITALS — BP 140/88 | HR 107 | Resp 16

## 2019-09-11 DIAGNOSIS — Z0130 Encounter for examination of blood pressure without abnormal findings: Secondary | ICD-10-CM

## 2019-09-11 NOTE — Progress Notes (Signed)
   Subjective:    Patient ID: Mark Bates, male    DOB: 1966/04/15, 53 y.o.   MRN: [id number]  HPI    Review of Systems     Objective:   Physical Exam  Assessment & Plan:   Wt Readings from Last 3 Encounters:  09/01/19 280 lb 3.2 oz (127.1 kg)  12/27/18 275 lb (124.7 kg)  08/06/17 277 lb (125.6 kg)   Temp Readings from Last 3 Encounters:  09/03/19 97.7 F (36.5 C) (Temporal)  09/02/19 99.1 F (37.3 C) (Oral)  12/27/18 98 F (36.7 C) (Tympanic)   BP Readings from Last 3 Encounters:  09/11/19 140/88  09/08/19 130/90  09/04/19 (!) 150/96   Pulse Readings from Last 3 Encounters:  09/11/19 (!) 107  09/08/19 94  09/04/19 75     Lab Results  Component Value Date   POCGLU 135 (A) 09/01/2019           Thank you!!  Apolonio Schneiders RN  [place] Nurse Specialist [place]: [phone]  Cell:  [phone] Website: [email or web address]

## 2019-09-12 ENCOUNTER — Ambulatory Visit: Payer: Self-pay

## 2019-09-12 VITALS — BP 128/84 | HR 77 | Resp 15

## 2019-09-12 DIAGNOSIS — Z0130 Encounter for examination of blood pressure without abnormal findings: Secondary | ICD-10-CM

## 2019-09-12 NOTE — Progress Notes (Signed)
   Subjective:    Patient ID: Mark Bates, male    DOB: 03-Sep-1966, 53 y.o.   MRN: [id number]  HPI    Review of Systems     Objective:   Physical Exam  Assessment & Plan:   Wt Readings from Last 3 Encounters:  09/01/19 280 lb 3.2 oz (127.1 kg)  12/27/18 275 lb (124.7 kg)  08/06/17 277 lb (125.6 kg)   Temp Readings from Last 3 Encounters:  09/03/19 97.7 F (36.5 C) (Temporal)  09/02/19 99.1 F (37.3 C) (Oral)  12/27/18 98 F (36.7 C) (Tympanic)   BP Readings from Last 3 Encounters:  09/12/19 128/84  09/11/19 140/88  09/08/19 130/90   Pulse Readings from Last 3 Encounters:  09/12/19 77  09/11/19 (!) 107  09/08/19 94     Lab Results  Component Value Date   POCGLU 135 (A) 09/01/2019           Thank you!!  Apolonio Schneiders RN  [place] Nurse Specialist [place]: [phone]  Cell:  [phone] Website: [email or web address]

## 2019-11-19 ENCOUNTER — Other Ambulatory Visit: Payer: Self-pay

## 2019-11-19 ENCOUNTER — Ambulatory Visit: Payer: Self-pay

## 2019-11-19 VITALS — BP 168/100 | HR 79 | Resp 16

## 2019-11-19 DIAGNOSIS — I10 Essential (primary) hypertension: Secondary | ICD-10-CM

## 2019-11-19 NOTE — Progress Notes (Signed)
   Subjective:    Patient ID: Mark Bates, male    DOB: 10-13-1966, 53 y.o.   MRN: [id number]  Presented to clinic for BP check due to an extended course of pseudoephedrine. Virtual visit with PCP and advised to have BP checked and discontinue pseudoephedrine due to history of HTN.     Review of Systems  HENT: Positive for sinus pressure and sinus pain.   All other systems reviewed and are negative.      Objective:   Physical Exam Vitals reviewed.  Constitutional:      Appearance: Normal appearance.  HENT:     Head: Normocephalic.  Cardiovascular:     Rate and Rhythm: Normal rate and regular rhythm.     Pulses: Normal pulses.     Heart sounds: Normal heart sounds.  Pulmonary:     Effort: Pulmonary effort is normal.     Breath sounds: Normal breath sounds.  Skin:    General: Skin is warm and dry.     Capillary Refill: Capillary refill takes less than 2 seconds.  Neurological:     Mental Status: He is alert.     Assessment & Plan:   Wt Readings from Last 3 Encounters:  09/01/19 280 lb 3.2 oz (127.1 kg)  12/27/18 275 lb (124.7 kg)  08/06/17 277 lb (125.6 kg)   Temp Readings from Last 3 Encounters:  09/03/19 97.7 F (36.5 C) (Temporal)  09/02/19 99.1 F (37.3 C) (Oral)  12/27/18 98 F (36.7 C) (Tympanic)   BP Readings from Last 3 Encounters:  11/19/19 (!) 168/100  09/12/19 128/84  09/11/19 140/88   Pulse Readings from Last 3 Encounters:  11/19/19 79  09/12/19 77  09/11/19 (!) 107     Lab Results  Component Value Date   POCGLU 135 (A) 09/01/2019           Thank you!!  Apolonio Schneiders RN  [place] Nurse Specialist [place]: [phone]  Cell:  [phone] Website: [email or web address]

## 2020-01-01 ENCOUNTER — Ambulatory Visit: Payer: Self-pay | Admitting: Nurse Practitioner

## 2020-01-01 ENCOUNTER — Encounter: Payer: Self-pay | Admitting: Nurse Practitioner

## 2020-01-01 ENCOUNTER — Other Ambulatory Visit: Payer: Self-pay

## 2020-01-01 VITALS — BP 142/90 | HR 73 | Temp 97.9°F | Resp 18 | Ht 75.0 in | Wt 283.0 lb

## 2020-01-01 DIAGNOSIS — H6123 Impacted cerumen, bilateral: Secondary | ICD-10-CM

## 2020-01-01 NOTE — Patient Instructions (Addendum)
Start over the counter debrox drops as directed and instill drops at night and lay on the opposite side to allow drops to drop down in ear. Return to the clinic in 1 week for left ear wax removal  Earwax Buildup, Adult The ears produce a substance called earwax that helps keep bacteria out of the ear and protects the skin in the ear canal. Occasionally, earwax can build up in the ear and cause discomfort or hearing loss. What increases the risk? This condition is more likely to develop in people who:  Are male.  Are elderly.  Naturally produce more earwax.  Clean their ears often with cotton swabs.  Use earplugs often.  Use in-ear headphones often.  Wear hearing aids.  Have narrow ear canals.  Have earwax that is overly thick or sticky.  Have eczema.  Are dehydrated.  Have excess hair in the ear canal. What are the signs or symptoms? Symptoms of this condition include:  Reduced or muffled hearing.  A feeling of fullness in the ear or feeling that the ear is plugged.  Fluid coming from the ear.  Ear pain.  Ear itch.  Ringing in the ear.  Coughing.  An obvious piece of earwax that can be seen inside the ear canal. How is this diagnosed?  This condition may be diagnosed based on:  Your symptoms.  Your medical history.  An ear exam. During the exam, your health care provider will look into your ear with an instrument called an otoscope. You may have tests, including a hearing test. How is this treated?  This condition may be treated by:  Using ear drops to soften the earwax.  Having the earwax removed by a health care provider. The health care provider may: ? Flush the ear with water. ? Use an instrument that has a loop on the end (curette). ? Use a suction device.  Surgery to remove the wax buildup. This may be done in severe cases. Follow these instructions at home:    Take over-the-counter and prescription medicines only as told by your health  care provider.  Do not put any objects, including cotton swabs, into your ear. You can clean the opening of your ear canal with a washcloth or facial tissue.  Follow instructions from your health care provider about cleaning your ears. Do not over-clean your ears.  Drink enough fluid to keep your urine clear or pale yellow. This will help to thin the earwax.  Keep all follow-up visits as told by your health care provider. If earwax builds up in your ears often or if you use hearing aids, consider seeing your health care provider for routine, preventive ear cleanings. Ask your health care provider how often you should schedule your cleanings.  If you have hearing aids, clean them according to instructions from the manufacturer and your health care provider. Contact a health care provider if:  You have ear pain.  You develop a fever.  You have blood, pus, or other fluid coming from your ear.  You have hearing loss.  You have ringing in your ears that does not go away.  Your symptoms do not improve with treatment.  You feel like the room is spinning (vertigo). Summary  Earwax can build up in the ear and cause discomfort or hearing loss.  The most common symptoms of this condition include reduced or muffled hearing and a feeling of fullness in the ear or feeling that the ear is plugged.  This condition may  be diagnosed based on your symptoms, your medical history, and an ear exam.  This condition may be treated by using ear drops to soften the earwax or by having the earwax removed by a health care provider.  Do not put any objects, including cotton swabs, into your ear. You can clean the opening of your ear canal with a washcloth or facial tissue. This information is not intended to replace advice given to you by your health care provider. Make sure you discuss any questions you have with your health care provider. Document Revised: 11/02/2017 Document Reviewed:  01/31/2017 Elsevier Patient Education  2020 [employer].

## 2020-01-01 NOTE — Progress Notes (Addendum)
   Subjective:    Patient ID: Mark Bates, male    DOB: 1966-03-19, 54 y.o.   MRN: [id number]  HPI Avion is here today with c/o left ear ringing and pressure. He denies pain any Covid symptoms or other respiratory symptoms. He endorses a reoccurence of ear wax and has tried OTC ear wax kits in the past with no success so he's not tried anyting OTC. He reports when he opens his jaw wide and closes it he seems to hear a popping.  Hx of HTN and see multiple recent visits for HTN in chart. Reports taking med as direct and now on 10 mg Amlodipine. Reports checks b/p at home and runs 125-130 with diastolic 80's.    Review of Systems  Constitutional: Negative for fever.  HENT: Negative for ear discharge, ear pain, sinus pressure and sore throat.        Left ear pressure  Respiratory: Negative for cough, shortness of breath and wheezing.        Objective:   Physical Exam Constitutional:      Appearance: Normal appearance.  HENT:     Head: Normocephalic and atraumatic.     Comments: Hardened dark cerumen deeply embedded in the EAC. Attempted to remove bilaterally with inability to clear left EAC but large amount removed. Right cerumen EAC successful    Right Ear: There is impacted cerumen.     Left Ear: There is impacted cerumen.  Musculoskeletal:     Cervical back: Normal range of motion and neck supple.  Neurological:     Mental Status: He is alert and oriented to person, place, and time.  Psychiatric:        Mood and Affect: Mood normal.           Assessment & Plan:  Pt tolerated procedure well with irrigation and curette. Was able to remove a large amount of dark brown cerumen but deeply impacted. To do OTC debrox gtts as directed x 1 week and RTC 1 week for repeat.

## 2020-01-02 ENCOUNTER — Ambulatory Visit: Payer: Self-pay

## 2020-01-08 ENCOUNTER — Ambulatory Visit: Payer: Self-pay | Admitting: Nurse Practitioner

## 2020-01-08 ENCOUNTER — Other Ambulatory Visit: Payer: Self-pay

## 2020-01-08 ENCOUNTER — Encounter: Payer: Self-pay | Admitting: Nurse Practitioner

## 2020-01-08 VITALS — BP 128/82 | HR 76 | Temp 97.9°F | Resp 18 | Ht 75.0 in | Wt 281.0 lb

## 2020-01-08 DIAGNOSIS — H6122 Impacted cerumen, left ear: Secondary | ICD-10-CM

## 2020-01-08 NOTE — Patient Instructions (Addendum)
Consider using over the counter debox drops as needed and return to the clinic if you develop any ear symptoms of fever, ear pain, decreased hearing Your blood pressure came down in the office but if remains elevated follow up with your primary care provider

## 2020-01-08 NOTE — Progress Notes (Signed)
   Subjective:    Patient ID: Mark Bates, male    DOB: 01-Oct-1966, 54 y.o.   MRN: [id number]  HPI Mark Bates is here for left ear cerumen removal follow up. Seen last week and unable to remove all of the ear wax d/t being deeply embedded and hardened. He has since used the OTC debrox gtts as instructed and has no new symptoms. Covid screening negative.  Review of Systems  Constitutional: Negative for fever.  HENT:       Left ear pressure but improved  Respiratory: Negative for cough and shortness of breath.   Cardiovascular: Negative for chest pain.       Objective:   Physical Exam Constitutional:      Appearance: Normal appearance.  HENT:     Head: Normocephalic and atraumatic.     Right Ear: There is no impacted cerumen.     Left Ear: There is impacted cerumen.     Ears:     Comments: Successful left ear irrigation and tolerated procedure well but c/o a little short lived dizziness. Left EAC clear after procedure with TM intact and some erythema to intact membrane and no fluid behind TM. Right TM WNL and EAC clear Musculoskeletal:     Cervical back: Normal range of motion and neck supple.  Skin:    General: Skin is warm and dry.  Neurological:     Mental Status: He is alert and oriented to person, place, and time.  Psychiatric:        Mood and Affect: Mood normal.           Assessment & Plan:

## 2020-02-06 ENCOUNTER — Ambulatory Visit: Payer: Self-pay | Attending: Internal Medicine

## 2020-02-06 DIAGNOSIS — Z23 Encounter for immunization: Secondary | ICD-10-CM | POA: Insufficient documentation

## 2020-02-06 NOTE — Progress Notes (Signed)
   Covid-19 Vaccination Clinic  Name:  Mark Bates    MRN: [id number] DOB: [birth year]  02/06/2020  Mark Bates was observed post Covid-19 immunization for 15 minutes without incident. He was provided with Vaccine Information Sheet and instruction to access the V-Safe system.   Mark Bates was instructed to call 911 with any severe reactions post vaccine: Marland Kitchen Difficulty breathing  . Swelling of face and throat  . A fast heartbeat  . A bad rash all over body  . Dizziness and weakness

## 2020-02-28 ENCOUNTER — Ambulatory Visit: Payer: Self-pay | Attending: Internal Medicine

## 2020-02-28 DIAGNOSIS — Z23 Encounter for immunization: Secondary | ICD-10-CM

## 2020-02-28 NOTE — Progress Notes (Signed)
   Covid-19 Vaccination Clinic  Name:  KELLYN MANSFIELD    MRN: [id number] DOB: 29-Nov-1966  02/28/2020  Mr. Gell was observed post Covid-19 immunization for 15 minutes without incident. He was provided with Vaccine Information Sheet and instruction to access the V-Safe system.   Mr. Tuzzolino was instructed to call 911 with any severe reactions post vaccine: Marland Kitchen Difficulty breathing  . Swelling of face and throat  . A fast heartbeat  . A bad rash all over body  . Dizziness and weakness   Immunizations Administered    Name Date Dose VIS Date Route   Pfizer COVID-19 Vaccine 02/28/2020  1:51 PM 0.3 mL 11/14/2019 Intramuscular   Manufacturer: [employer], [employer]   Lot: [id number]   NDC: [address]-5

## 2020-03-08 ENCOUNTER — Ambulatory Visit: Payer: Self-pay

## 2020-06-29 ENCOUNTER — Emergency Department
Admission: EM | Admit: 2020-06-29 | Discharge: 2020-06-29 | Disposition: A | Discharge status: Home / Self Care | Payer: BC Managed Care – PPO | Attending: Emergency Medicine | Admitting: Emergency Medicine

## 2020-06-29 ENCOUNTER — Other Ambulatory Visit: Payer: Self-pay

## 2020-06-29 ENCOUNTER — Emergency Department: Payer: BC Managed Care – PPO

## 2020-06-29 DIAGNOSIS — R001 Bradycardia, unspecified: Secondary | ICD-10-CM | POA: Insufficient documentation

## 2020-06-29 DIAGNOSIS — I10 Essential (primary) hypertension: Secondary | ICD-10-CM | POA: Insufficient documentation

## 2020-06-29 DIAGNOSIS — Z87891 Personal history of nicotine dependence: Secondary | ICD-10-CM | POA: Diagnosis not present

## 2020-06-29 DIAGNOSIS — R42 Dizziness and giddiness: Secondary | ICD-10-CM | POA: Insufficient documentation

## 2020-06-29 DIAGNOSIS — R55 Syncope and collapse: Secondary | ICD-10-CM | POA: Diagnosis not present

## 2020-06-29 DIAGNOSIS — Z79899 Other long term (current) drug therapy: Secondary | ICD-10-CM | POA: Insufficient documentation

## 2020-06-29 LAB — BASIC METABOLIC PANEL
Anion gap: 9 (ref 5–15)
BUN: 18 mg/dL (ref 6–20)
CO2: 28 mmol/L (ref 22–32)
Calcium: 8.9 mg/dL (ref 8.9–10.3)
Chloride: 103 mmol/L (ref 98–111)
Creatinine, Ser: 1.42 mg/dL — ABNORMAL HIGH (ref 0.61–1.24)
GFR calc Af Amer: >60 mL/min (ref >60)
GFR calc non Af Amer: 56 mL/min — ABNORMAL LOW (ref >60)
Glucose, Bld: 109 mg/dL — ABNORMAL HIGH (ref 70–99)
Potassium: 4 mmol/L (ref 3.5–5.1)
Sodium: 140 mmol/L (ref 135–145)

## 2020-06-29 LAB — URINALYSIS, COMPLETE (UACMP) WITH MICROSCOPIC
Bacteria, UA: NONE SEEN
Bilirubin Urine: NEGATIVE
Glucose, UA: NEGATIVE mg/dL
Hgb urine dipstick: NEGATIVE
Ketones, ur: NEGATIVE mg/dL
Leukocytes,Ua: NEGATIVE
Nitrite: NEGATIVE
Protein, ur: NEGATIVE mg/dL
Specific Gravity, Urine: 1.006 (ref 1.005–1.030)
Squamous Epithelial / HPF: NONE SEEN (ref 0–5)
pH: 7 (ref 5.0–8.0)

## 2020-06-29 LAB — CBC
HCT: 43.7 % (ref 39.0–52.0)
Hemoglobin: 14.6 g/dL (ref 13.0–17.0)
MCH: 30 pg (ref 26.0–34.0)
MCHC: 33.4 g/dL (ref 30.0–36.0)
MCV: 89.7 fL (ref 80.0–100.0)
Platelets: 182 10*3/uL (ref 150–400)
RBC: 4.87 MIL/uL (ref 4.22–5.81)
RDW: 12.7 % (ref 11.5–15.5)
WBC: 7.1 10*3/uL (ref 4.0–10.5)
nRBC: 0 % (ref 0.0–0.2)

## 2020-06-29 LAB — GLUCOSE, CAPILLARY: Glucose-Capillary: 103 mg/dL — ABNORMAL HIGH (ref 70–99)

## 2020-06-29 LAB — TROPONIN I (HIGH SENSITIVITY): Troponin I (High Sensitivity): 4 ng/L (ref <18)

## 2020-06-29 MED ORDER — LACTATED RINGERS IV BOLUS
1000.0000 mL | Freq: Once | INTRAVENOUS | Status: AC
  Administered 2020-06-29: 1000 mL via INTRAVENOUS

## 2020-06-29 NOTE — ED Triage Notes (Signed)
Pt states he was sitting in work meeting, states he esophagus was spasming, then started getting hot and cold sweat. Pt reports he had syncopal episode.  Pt reports he has had syncopal episodes several years ago.   Pt reports EMS evaluated him on scene. CBG was in the 90s per pt.

## 2020-06-29 NOTE — ED Notes (Signed)
D/w Dr. Roxan Hockey, order CT since patient had syncopal episode and hit his head and is c/o headache. Pt denies any neck pain.

## 2020-06-29 NOTE — ED Notes (Signed)
See triage note- pt reports passing out at work. Witnesses report pt fell forward and hit his head, then fell back and hit the back of his head. Pt reports only head pain at this time.

## 2020-06-29 NOTE — ED Triage Notes (Signed)
First nurse note- pt was at work and had syncope episode.  Ambulatory. NAD

## 2020-06-29 NOTE — ED Triage Notes (Signed)
Pt reports when he passed out he fell out of his chair and did hit his head.

## 2020-06-29 NOTE — ED Provider Notes (Signed)
[facility] Emergency Department Provider Note   ____________________________________________   First MD Initiated Contact with Patient 06/29/20 1848     (approximate)  I have reviewed the triage vital signs and the nursing notes.   HISTORY  Chief Complaint Loss of Consciousness    HPI Mark Bates is a 54 y.o. male with past medical history of hypertension, IBS, and GERD who presents to the ED complaining of syncope.  Patient reports that he was feeling fine when he woke up this morning, but while he was at work during a meeting he started feeling like his esophagus was spasming.  He then started feeling lightheaded and warm throughout his body with blurry vision and tingling in his hands.  He proceeded to pass out and was told by coworkers that he fell out of his chair and hit his head on the ground.  When he woke up, he had developed a headache but denies other areas of pain.  He does not have any chest pain and denies any shortness of breath.  Has otherwise been feeling well recently with no fevers or cough.  He reports longstanding issues with esophageal spasms and IBS, which was similar today.  He has had 2 prior episodes of passing out which she describes as similar.  At the time of the last episode in 2017 he had work-up by cardiology that was unremarkable, including Holter monitor.        Past Medical History:  Diagnosis Date  . Depression   . Diverticulitis   . Esophageal motility disorder   . Gastritis   . GERD (gastroesophageal reflux disease)   . Hypertension   . Intestinal metaplasia of gastric mucosa   . Irregular Z line of esophagus   . Irritable bowel syndrome (IBS)     Patient Active Problem List   Diagnosis Date Noted  . GERD (gastroesophageal reflux disease) 08/06/2017  . Impingement syndrome of shoulder region 08/06/2017  . Derangement of knee 08/06/2017  . Adhesive capsulitis of shoulder 08/06/2017  . Biceps tendinitis  08/06/2017  . Abnormal gait 08/06/2017  . Irritable bowel syndrome without diarrhea 10/08/2014    Past Surgical History:  Procedure Laterality Date  . CHOLECYSTECTOMY    . COLONOSCOPY    . ESOPHAGOGASTRODUODENOSCOPY (EGD) WITH PROPOFOL N/A 07/23/2015   Procedure: ESOPHAGOGASTRODUODENOSCOPY (EGD) WITH PROPOFOL;  Surgeon: Christena Deem, MD;  Location: [facility] ENDOSCOPY;  Service: Endoscopy;  Laterality: N/A;  . ESOPHAGOGASTRODUODENOSCOPY (EGD) WITH PROPOFOL N/A 12/27/2018   Procedure: ESOPHAGOGASTRODUODENOSCOPY (EGD) WITH PROPOFOL;  Surgeon: Christena Deem, MD;  Location[facility] ENDOSCOPY;  Service: Endoscopy;  Laterality: N/A;  . GALLBLADDER SURGERY    . KNEE ARTHROSCOPY Bilateral X3  . WRIST SURGERY      Prior to Admission medications   Medication Sig Start Date End Date Taking? Authorizing Provider  amLODipine (NORVASC) 5 MG tablet Take 5 mg by mouth at bedtime.     [provider]  cetirizine (ZYRTEC) 10 MG tablet Take 10 mg by mouth at bedtime.     [provider]  dexlansoprazole (DEXILANT) 60 MG capsule Take 60 mg by mouth daily.    [provider]  dicyclomine (BENTYL) 10 MG capsule Take by mouth. 09/28/16 09/28/17  [provider]  fluticasone (FLONASE) 50 MCG/ACT nasal spray Place 2 sprays into both nostrils daily as needed for rhinitis.     [provider]  hyoscyamine (LEVSIN) 0.125 MG/5ML ELIX Take 0.125 mg by mouth every 4 (four) hours as  needed.    [provider]  ibandronate (BONIVA) 150 MG tablet Take 150 mg by mouth every 30 (thirty) days. Take in the morning with a full glass of water, on an empty stomach, and do not take anything else by mouth or lie down for the next 30 min.    [provider]  meloxicam (MOBIC) 7.5 MG tablet Take 7.5 mg by mouth daily.    [provider]  Multiple Vitamin (MULTIVITAMIN WITH MINERALS) TABS tablet Take 1 tablet by mouth daily.    [provider]  OVER THE  COUNTER MEDICATION Take 1 capsule by mouth daily. Pt takes IBgard.    [provider]  predniSONE (STERAPRED UNI-PAK 21 TAB) 10 MG (21) TBPK tablet Take  6 tablets by mouth  Today and 5 tablets tomorrow then one tablet  less each day there after, take with food. Patient not taking: Reported on 01/01/2020 08/06/17   Doy Mince, PA-C  Probiotic Product [facility] COLON HEALTH) CAPS Take 1 capsule by mouth daily.    [provider]  Wheat Dextrin (BENEFIBER DRINK MIX PO) Take 1 packet by mouth daily.    [provider]    Allergies Patient has no known allergies.  Family History  Problem Relation Age of Onset  . Hypertension Father   . Pulmonary fibrosis Father   . Alcohol abuse Sister   . Hypertension Brother   . Coronary artery disease Paternal Grandfather   . Hyperlipidemia Paternal Grandfather     Social History Social History   Tobacco Use  . Smoking status: Former Smoker    Types: Cigarettes    Quit date: 08/06/1986    Years since quitting: 33.9  . Smokeless tobacco: Never Used  Vaping Use  . Vaping Use: Never used  Substance Use Topics  . Alcohol use: Yes    Comment: occ  . Drug use: Never    Review of Systems  Constitutional: No fever/chills Eyes: No visual changes. ENT: No sore throat. Cardiovascular: Denies chest pain.  Positive for lightheadedness and syncope. Respiratory: Denies shortness of breath. Gastrointestinal: No abdominal pain.  No nausea, no vomiting.  No diarrhea.  No constipation. Genitourinary: Negative for dysuria. Musculoskeletal: Negative for back pain. Skin: Negative for rash. Neurological: Positive for headaches, negative for focal weakness or numbness.  ____________________________________________   PHYSICAL EXAM:  VITAL SIGNS: ED Triage Vitals  Enc Vitals Group     BP 06/29/20 1624 (!) 121/91     Pulse Rate 06/29/20 1624 82     Resp 06/29/20 1624 20     Temp 06/29/20 1624 99.3 F (37.4 C)      Temp Source 06/29/20 1624 Oral     SpO2 06/29/20 1624 99 %     Weight 06/29/20 1622 (!) 255 lb (115.7 kg)     Height 06/29/20 1622 6\' 3"  (1.905 m)     Head Circumference --      Peak Flow --      Pain Score 06/29/20 1622 4     Pain Loc --      Pain Edu? --      Excl. in GC? --     Constitutional: Alert and oriented. Eyes: Conjunctivae are normal. Head: Atraumatic. Nose: No congestion/rhinnorhea. Mouth/Throat: Mucous membranes are moist. Neck: Normal ROM Cardiovascular: Normal rate, regular rhythm. Grossly normal heart sounds.  2+ radial and DP pulses bilaterally. Respiratory: Normal respiratory effort.  No retractions. Lungs CTAB. Gastrointestinal: Soft and nontender. No distention. Genitourinary: deferred Musculoskeletal:  No lower extremity tenderness nor edema. Neurologic:  Normal speech and language. No gross focal neurologic deficits are appreciated. Skin:  Skin is warm, dry and intact. No rash noted. Psychiatric: Mood and affect are normal. Speech and behavior are normal.  ____________________________________________   LABS (all labs ordered are listed, but only abnormal results are displayed)  Labs Reviewed  BASIC METABOLIC PANEL - Abnormal; Notable for the following components:      Result Value   Glucose, Bld 109 (*)    Creatinine, Ser 1.42 (*)    GFR calc non Af Amer 56 (*)    All other components within normal limits  URINALYSIS, COMPLETE (UACMP) WITH MICROSCOPIC - Abnormal; Notable for the following components:   Color, Urine STRAW (*)    APPearance CLEAR (*)    All other components within normal limits  GLUCOSE, CAPILLARY - Abnormal; Notable for the following components:   Glucose-Capillary 103 (*)    All other components within normal limits  CBC  CBG MONITORING, ED  TROPONIN I (HIGH SENSITIVITY)   ____________________________________________  EKG  ED ECG REPORT I, Chesley Noon, the attending physician, personally viewed and interpreted this  ECG.   Date: 06/29/2020  EKG Time: 16:20  Rate: 86  Rhythm: normal sinus rhythm  Axis: Normal  Intervals:none  ST&T Change: None   PROCEDURES  Procedure(s) performed (including Critical Care):  Procedures   ____________________________________________   INITIAL IMPRESSION / ASSESSMENT AND PLAN / ED COURSE       54 year old male with past medical history of hypertension, IBS, and GERD who presents to the ED following a syncopal episode at work after he felt like his esophagus was spasming.  EKG shows no evidence of arrhythmia or ischemia, we will screen troponin but I have low suspicion for cardiac etiology given his similar prior episodes with unremarkable work-up.  CT head was negative for acute process and patient has no focal findings on neurologic exam.  Remainder of labs are unremarkable, we will hydrate with IV fluids and observe briefly on cardiac monitor, but if this is reassuring he will be appropriate for discharge home with PCP and cardiology follow-up.  Given the way patient describes the episode, it seems most consistent with vasovagal cause.  Patient observed to be in sinus bradycardia on cardiac monitor but he has had no further episodes of lightheadedness or syncope.  Troponin is within normal limits and I doubt ACS.  He is appropriate for discharge home and was advised to follow-up with his cardiologist as well as PCP.  He was counseled to return to the ED for new worsening symptoms, patient agrees with plan.      ____________________________________________   FINAL CLINICAL IMPRESSION(S) / ED DIAGNOSES  Final diagnoses:  Syncope and collapse  Sinus bradycardia     ED Discharge Orders    None       Note:  This document was prepared using Dragon voice recognition software and may include unintentional dictation errors.   Chesley Noon, MD 06/29/20 2050

## 2020-09-14 IMAGING — CT CT HEAD W/O CM
3 of 6 series · 15 of 47 positions shown, 18 images · non-contrast
Comparison: Brain MRI 09/21/2015.

CLINICAL DATA: 54-year-old male with syncope and head injury.
Headache.

EXAM:
CT HEAD WITHOUT CONTRAST
TECHNIQUE: Contiguous axial images were obtained from the base of the skull
through the vertex without intravenous contrast.

[Series 2: head wo · axial · 0.47mm/px · z∈[-134,-9]mm · 10 of 31 slices shown, 13 images]
[im 3/31  brain]
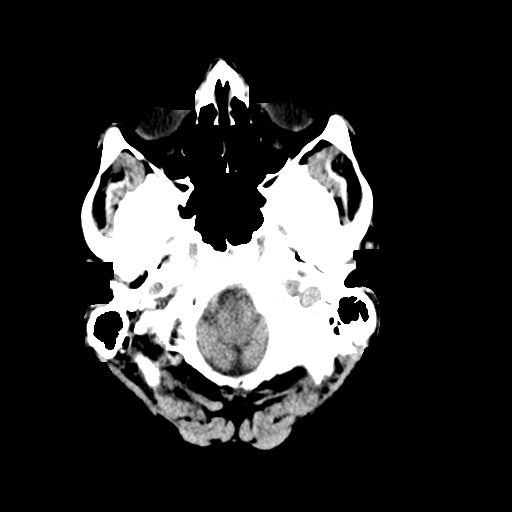
[im 3/31  bone]
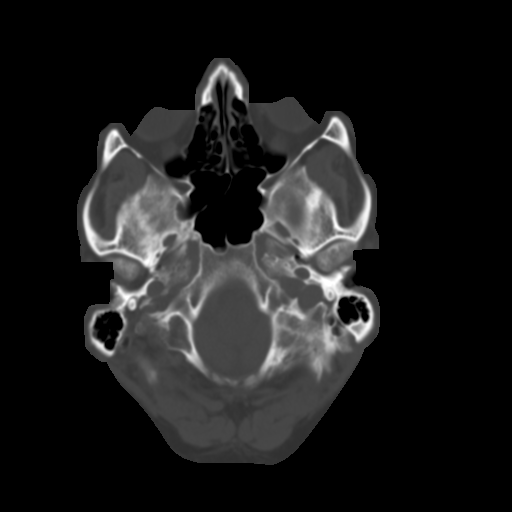
[im 5/31  brain]
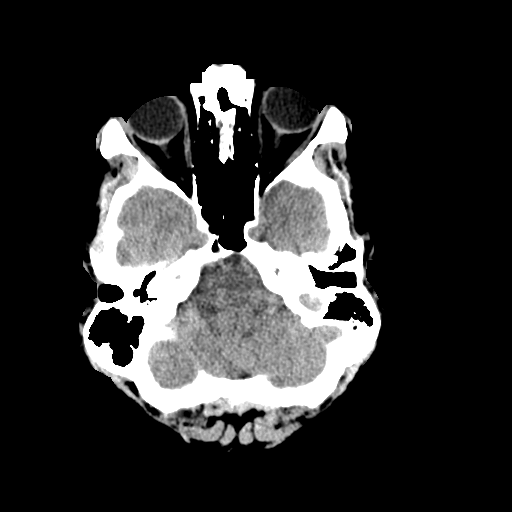
[im 9/31  brain]
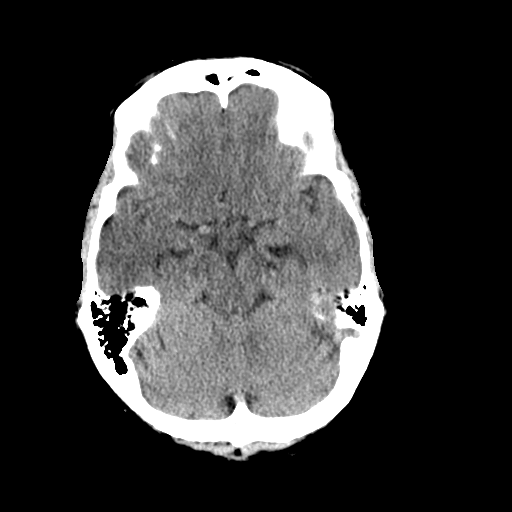
[im 11/31  brain]
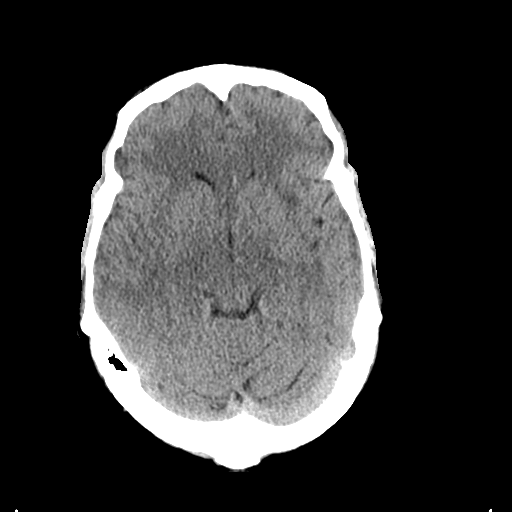
[im 13/31  brain]
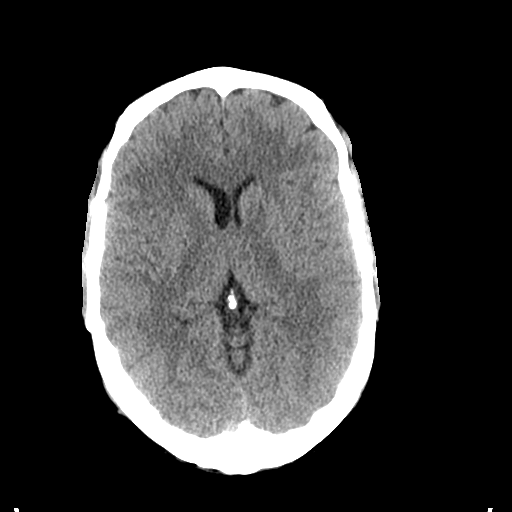
[im 13/31  bone]
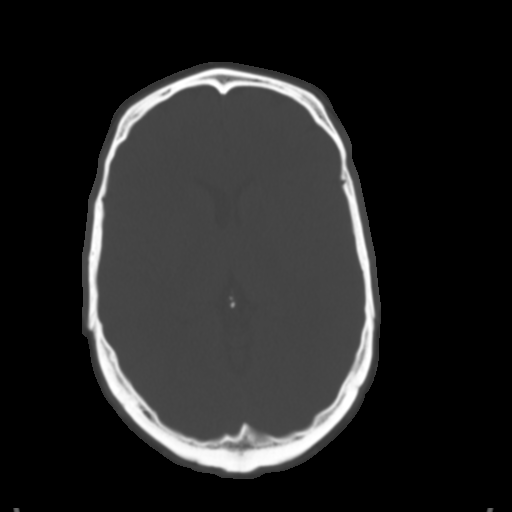
[im 18/31  brain]
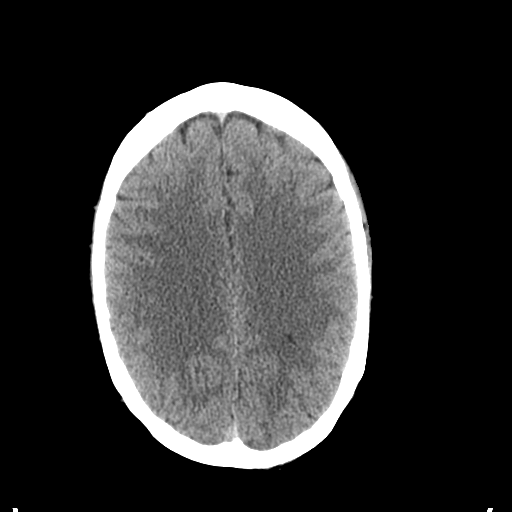
[im 20/31  brain]
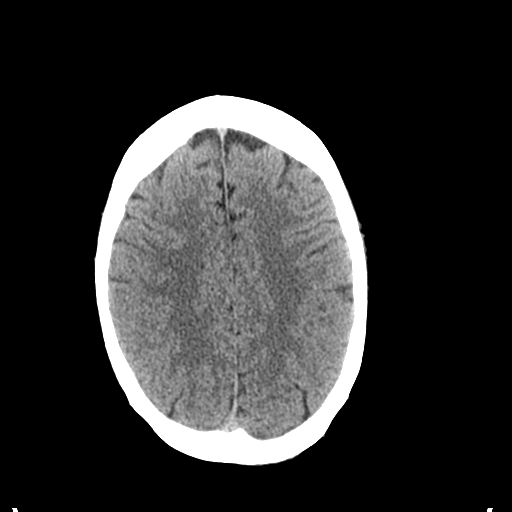
[im 22/31  brain]
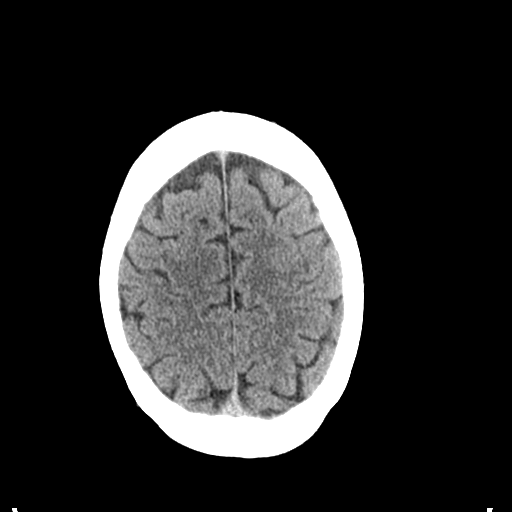
[im 26/31  brain]
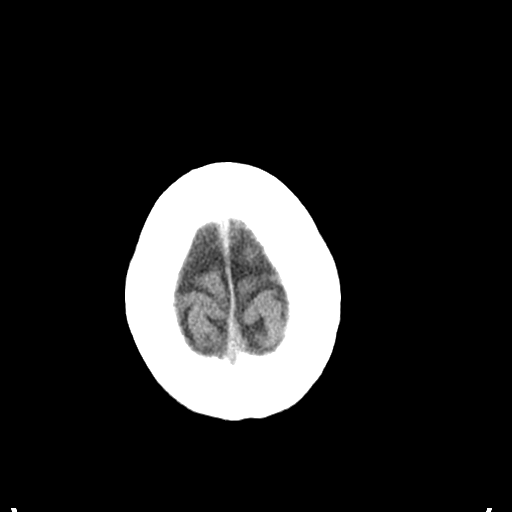
[im 26/31  bone]
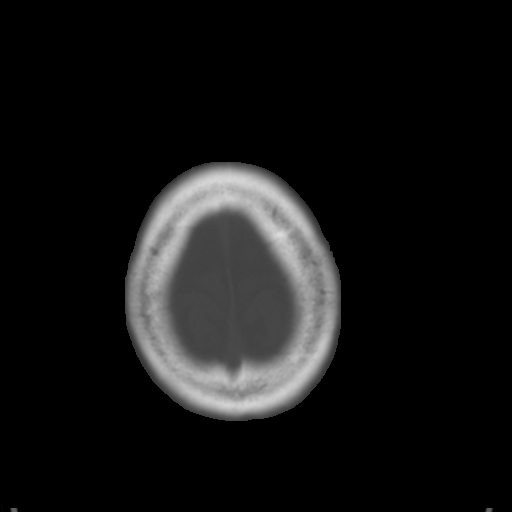
[im 28/31  brain]
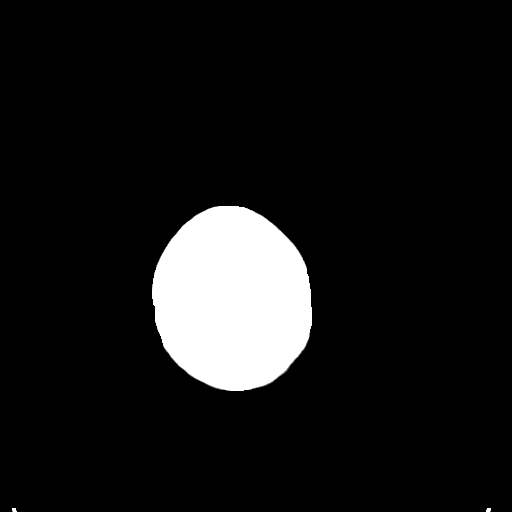

[Series 4: coronal soft tissue · coronal · 0.30mm/px · 3 of 69 slices shown]
[im 18/69  brain]
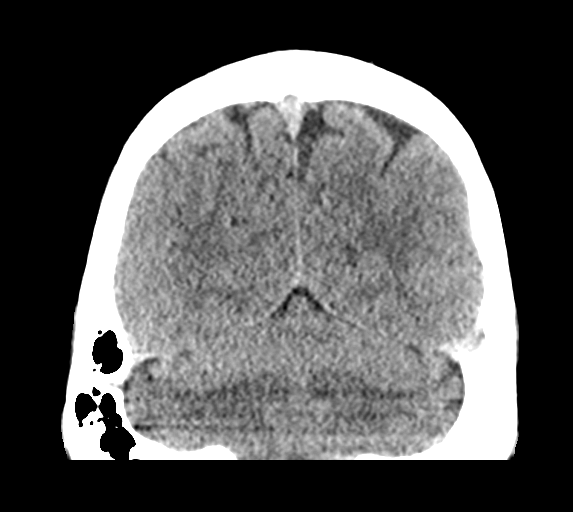
[im 35/69  brain]
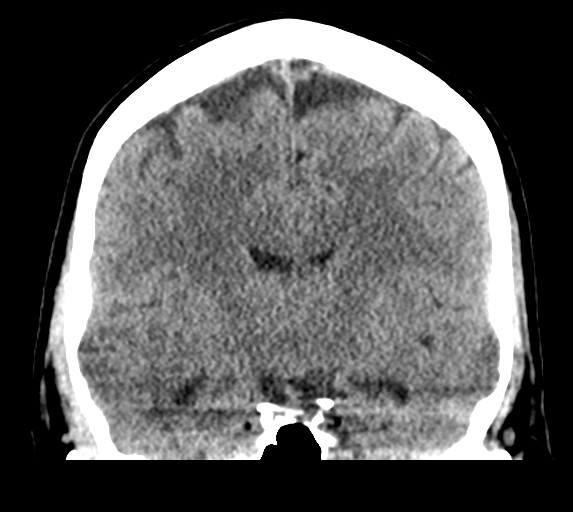
[im 52/69  brain]
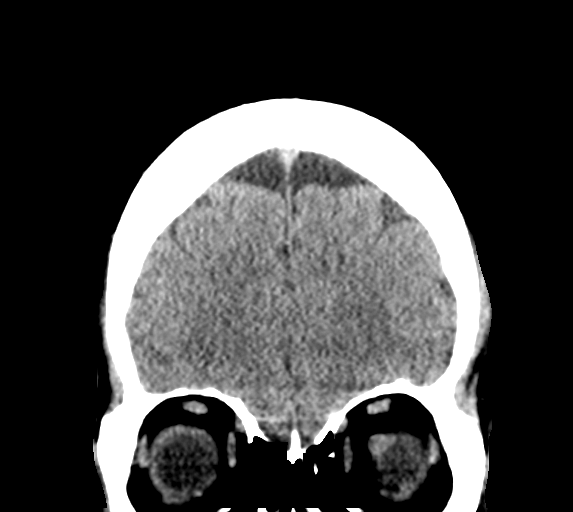

[Series 5: sagittal soft tissue · sagittal · 0.30mm/px · 2 of 57 slices shown]
[im 19/57  brain]
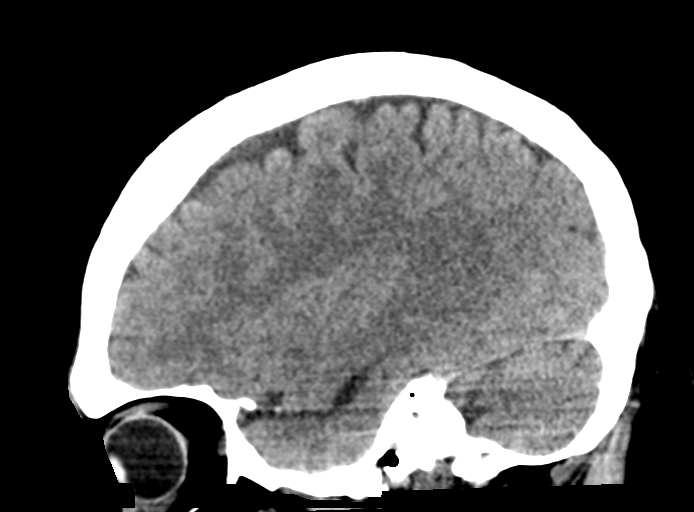
[im 38/57  brain]
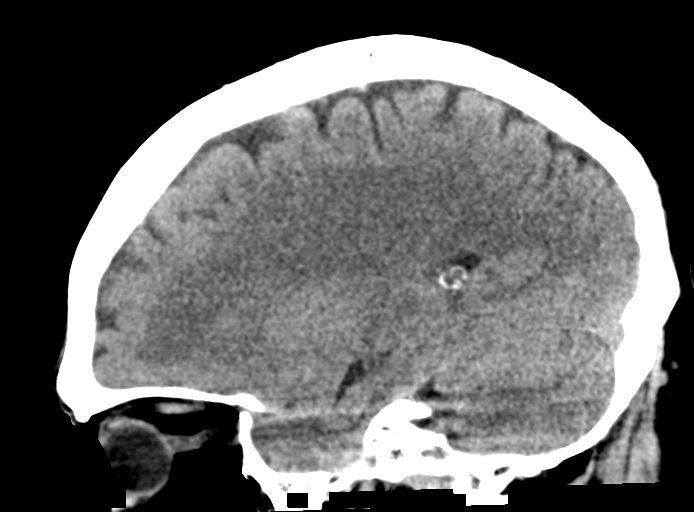

[15 of 47 positions shown; findings below may reference images not displayed]

FINDINGS: Brain: Stable cerebral volume since the 0298 MRI, within normal
limits. No midline shift, ventriculomegaly, mass effect, evidence of
mass lesion, intracranial hemorrhage or evidence of cortically based
acute infarction. Gray-white matter differentiation is within normal
limits throughout the brain. Small perivascular space redemonstrated
in the left periatrial white matter (normal variant).

Vascular: No suspicious intracranial vascular hyperdensity.

Skull: Negative.

Sinuses/Orbits: Visualized paranasal sinuses and mastoids are clear.

Other: No acute orbit or scalp soft tissue injury identified.
IMPRESSION: Normal noncontrast CT appearance of the brain. No acute traumatic
injury identified.

## 2020-12-12 ENCOUNTER — Other Ambulatory Visit: Payer: Self-pay

## 2020-12-12 ENCOUNTER — Other Ambulatory Visit: Payer: BC Managed Care – PPO

## 2020-12-12 DIAGNOSIS — Z20822 Contact with and (suspected) exposure to covid-19: Secondary | ICD-10-CM

## 2020-12-14 LAB — SARS-COV-2, NAA 2 DAY TAT

## 2020-12-14 LAB — NOVEL CORONAVIRUS, NAA: SARS-CoV-2, NAA: NOT DETECTED

## 2020-12-31 ENCOUNTER — Other Ambulatory Visit: Payer: BC Managed Care – PPO

## 2020-12-31 DIAGNOSIS — Z20822 Contact with and (suspected) exposure to covid-19: Secondary | ICD-10-CM

## 2021-01-02 LAB — SARS-COV-2, NAA 2 DAY TAT

## 2021-01-02 LAB — NOVEL CORONAVIRUS, NAA: SARS-CoV-2, NAA: NOT DETECTED

## 2021-09-14 ENCOUNTER — Other Ambulatory Visit: Payer: Self-pay

## 2021-09-14 ENCOUNTER — Ambulatory Visit: Payer: Self-pay

## 2021-09-14 DIAGNOSIS — Z23 Encounter for immunization: Secondary | ICD-10-CM

## 2022-02-28 ENCOUNTER — Other Ambulatory Visit: Payer: Self-pay

## 2022-02-28 ENCOUNTER — Emergency Department: Payer: BC Managed Care – PPO

## 2022-02-28 ENCOUNTER — Emergency Department
Admission: EM | Admit: 2022-02-28 | Discharge: 2022-02-28 | Disposition: A | Discharge status: Home / Self Care | Payer: BC Managed Care – PPO | Attending: Emergency Medicine | Admitting: Emergency Medicine

## 2022-02-28 ENCOUNTER — Encounter: Payer: Self-pay | Admitting: Emergency Medicine

## 2022-02-28 DIAGNOSIS — I10 Essential (primary) hypertension: Secondary | ICD-10-CM | POA: Insufficient documentation

## 2022-02-28 DIAGNOSIS — R0789 Other chest pain: Secondary | ICD-10-CM | POA: Insufficient documentation

## 2022-02-28 LAB — CBC WITH DIFFERENTIAL/PLATELET
Abs Immature Granulocytes: 0.01 10*3/uL (ref 0.00–0.07)
Basophils Absolute: 0.1 10*3/uL (ref 0.0–0.1)
Basophils Relative: 2 %
Eosinophils Absolute: 0.1 10*3/uL (ref 0.0–0.5)
Eosinophils Relative: 2 %
HCT: 44.3 % (ref 39.0–52.0)
Hemoglobin: 14.6 g/dL (ref 13.0–17.0)
Immature Granulocytes: 0 %
Lymphocytes Relative: 39 %
Lymphs Abs: 2.1 10*3/uL (ref 0.7–4.0)
MCH: 29.4 pg (ref 26.0–34.0)
MCHC: 33 g/dL (ref 30.0–36.0)
MCV: 89.3 fL (ref 80.0–100.0)
Monocytes Absolute: 0.3 10*3/uL (ref 0.1–1.0)
Monocytes Relative: 6 %
Neutro Abs: 2.7 10*3/uL (ref 1.7–7.7)
Neutrophils Relative %: 51 %
Platelets: 226 10*3/uL (ref 150–400)
RBC: 4.96 MIL/uL (ref 4.22–5.81)
RDW: 12.7 % (ref 11.5–15.5)
WBC: 5.3 10*3/uL (ref 4.0–10.5)
nRBC: 0 % (ref 0.0–0.2)

## 2022-02-28 LAB — COMPREHENSIVE METABOLIC PANEL
ALT: 30 U/L (ref 0–44)
AST: 24 U/L (ref 15–41)
Albumin: 4.2 g/dL (ref 3.5–5.0)
Alkaline Phosphatase: 113 U/L (ref 38–126)
Anion gap: 8 (ref 5–15)
BUN: 14 mg/dL (ref 6–20)
CO2: 26 mmol/L (ref 22–32)
Calcium: 9.1 mg/dL (ref 8.9–10.3)
Chloride: 107 mmol/L (ref 98–111)
Creatinine, Ser: 1.11 mg/dL (ref 0.61–1.24)
GFR, Estimated: >60 mL/min (ref >60)
Glucose, Bld: 114 mg/dL — ABNORMAL HIGH (ref 70–99)
Potassium: 3.8 mmol/L (ref 3.5–5.1)
Sodium: 141 mmol/L (ref 135–145)
Total Bilirubin: 0.8 mg/dL (ref 0.3–1.2)
Total Protein: 7.6 g/dL (ref 6.5–8.1)

## 2022-02-28 LAB — TROPONIN I (HIGH SENSITIVITY)
Troponin I (High Sensitivity): 7 ng/L (ref <18)
Troponin I (High Sensitivity): 6 ng/L (ref <18)

## 2022-02-28 MED ORDER — ALUM & MAG HYDROXIDE-SIMETH 200-200-20 MG/5ML PO SUSP
30.0000 mL | Freq: Once | ORAL | Status: AC
  Administered 2022-02-28: 30 mL via ORAL
  Filled 2022-02-28: qty 30

## 2022-02-28 MED ORDER — LIDOCAINE VISCOUS HCL 2 % MT SOLN
15.0000 mL | Freq: Once | OROMUCOSAL | Status: AC
  Administered 2022-02-28: 15 mL via ORAL
  Filled 2022-02-28: qty 15

## 2022-02-28 MED ORDER — IOHEXOL 350 MG/ML SOLN
75.0000 mL | Freq: Once | INTRAVENOUS | Status: AC | PRN
  Administered 2022-02-28: 75 mL via INTRAVENOUS
  Filled 2022-02-28: qty 75

## 2022-02-28 NOTE — ED Provider Notes (Signed)
? ?[facility] ?Provider Note ? ? ? Event Date/Time  ? First MD Initiated Contact with Patient 02/28/22 [phone]   ?  (approximate) ? ? ?History  ? ?Chest Pain ? ? ?HPI ? ?JB Mark Bates is a 56 y.o. male with a history of esophageal spasms, hypertension, IBS who presents with complaints of chest discomfort.  Patient reports yesterday he had mild discomfort in his chest and decided to stay home from work.  Last night he woke up around 3 to 4 AM with chest discomfort and broke out in a cold sweat.  He is feeling improved now.  He thinks this has some components that are similar to when he has esophageal spasm but it is difficult to be sure.  He does not smoke.  No history of heart disease reported.  No calf pain swelling, no pleurisy, no shortness of breath, no fevers. ?  ? ? ?Physical Exam  ? ?Triage Vital Signs: ?ED Triage Vitals  ?Enc Vitals Group  ?   BP 02/28/22 0442 132/87  ?   Pulse Rate 02/28/22 0442 76  ?   Resp 02/28/22 0442 18  ?   Temp 02/28/22 0442 97.8 ?F (36.6 ?C)  ?   Temp Source 02/28/22 0442 Oral  ?   SpO2 02/28/22 0442 98 %  ?   Weight 02/28/22 0437 124.7 kg (275 lb)  ?   Height 02/28/22 0437 1.905 m (6\' 3" )  ?   Head Circumference --   ?   Peak Flow --   ?   Pain Score 02/28/22 0437 5  ?   Pain Loc --   ?   Pain Edu? --   ?   Excl. in GC? --   ? ? ?Most recent vital signs: ?Vitals:  ? 02/28/22 0714 02/28/22 0913  ?BP: 138/87 (!) 148/88  ?Pulse: 80 85  ?Resp: 18 18  ?Temp:    ?SpO2: 98% 99%  ? ? ? ?General: Awake, no distress.  ?CV:  Good peripheral perfusion.  Regular rate and rhythm, no murmur ?Resp:  Normal effort.  CTA bilaterally ?Abd:  No distention.  No epigastric tenderness palpation ?Other:  No lower extremity swelling or pain ? ? ?ED Results / Procedures / Treatments  ? ?Labs ?(all labs ordered are listed, but only abnormal results are displayed) ?Labs Reviewed  ?COMPREHENSIVE METABOLIC PANEL - Abnormal; Notable for the following components:  ?    Result Value  ? Glucose,  Bld 114 (*)   ? All other components within normal limits  ?CBC WITH DIFFERENTIAL/PLATELET  ?TROPONIN I (HIGH SENSITIVITY)  ?TROPONIN I (HIGH SENSITIVITY)  ? ? ? ?EKG ? ?ED ECG REPORT ?I, 03/02/22, the attending physician, personally viewed and interpreted this ECG. ? ?Date: 02/28/2022 ? ?Rhythm: normal sinus rhythm ?QRS Axis: normal ?Intervals: normal ?ST/T Wave abnormalities: normal ?Narrative Interpretation: no evidence of acute ischemia ? ? ? ?RADIOLOGY ?Chest x-ray viewed interpreted by me, no acute abnormality ? ? ? ?PROCEDURES: ? ?Critical Care performed:  ? ?Procedures ? ? ?MEDICATIONS ORDERED IN ED: ?Medications  ?alum & mag hydroxide-simeth (MAALOX/MYLANTA) 200-200-20 MG/5ML suspension 30 mL (30 mLs Oral Given 02/28/22 0738)  ?  And  ?lidocaine (XYLOCAINE) 2 % viscous mouth solution 15 mL (15 mLs Oral Given 02/28/22 0738)  ?iohexol (OMNIPAQUE) 350 MG/ML injection 75 mL (75 mLs Intravenous Contrast Given 02/28/22 0908)  ? ? ? ?IMPRESSION / MDM / ASSESSMENT AND PLAN / ED COURSE  ?I reviewed the triage vital signs and  the nursing notes. ? ? ?Patient presents with chest pain as detailed above.  I doubt ACS given reassuring EKG and normal initial high-sensitivity troponin, however will send delta troponin ? ?Not consistent with PE, no pleurisy or shortness of breath or tachycardia ? ?Not consistent with dissection or pneumonia or pneumothorax.  Chest x-ray is reassuring ? ?Lab work demonstrates normal CBC, normal CMP ? ?We will give GI cocktail, obtain second troponin given his history of esophageal disorder ? ?Patient had improvement after GI cocktail, via shared decision making we did opt for CT angiography to rule out PE, aortic issue ? ?CT scan was reassuring.  Offered admission to the patient for observation however he declined and would like to be discharged with close follow-up with cardiology.  He knows to return immediately if any change in his symptoms ? ? ?  ? ? ?FINAL CLINICAL IMPRESSION(S) / ED  DIAGNOSES  ? ?Final diagnoses:  ?Atypical chest pain  ? ? ? ?Rx / DC Orders  ? ?ED Discharge Orders   ? ? None  ? ?  ? ? ? ?Note:  This document was prepared using Dragon voice recognition software and may include unintentional dictation errors. ?  ?Jene Every, MD ?02/28/22 [phone] ? ?

## 2022-02-28 NOTE — ED Notes (Signed)
See triage note  presents with upper chest pain which has been intermittent for several days  denies any n/v   has had slight cough no fever ?

## 2022-02-28 NOTE — ED Triage Notes (Signed)
Patient ambulatory to triage with steady gait, without difficulty or distress noted; pt reports "woke up in a cold sweat" with upper CP; st hx esophageal spasms ?

## 2022-05-16 IMAGING — CR DG CHEST 2V
1 series · 2 of 2 positions shown · non-contrast
Comparison: Chest radiographs 08/04/2014.

CLINICAL DATA: 56-year-old male with chest pain, cold sweat.

EXAM:
CHEST - 2 VIEW

[Series 1: dg chest 2 view · 0.14mm/px · 2 of 2 slices shown]
[im 1/2]
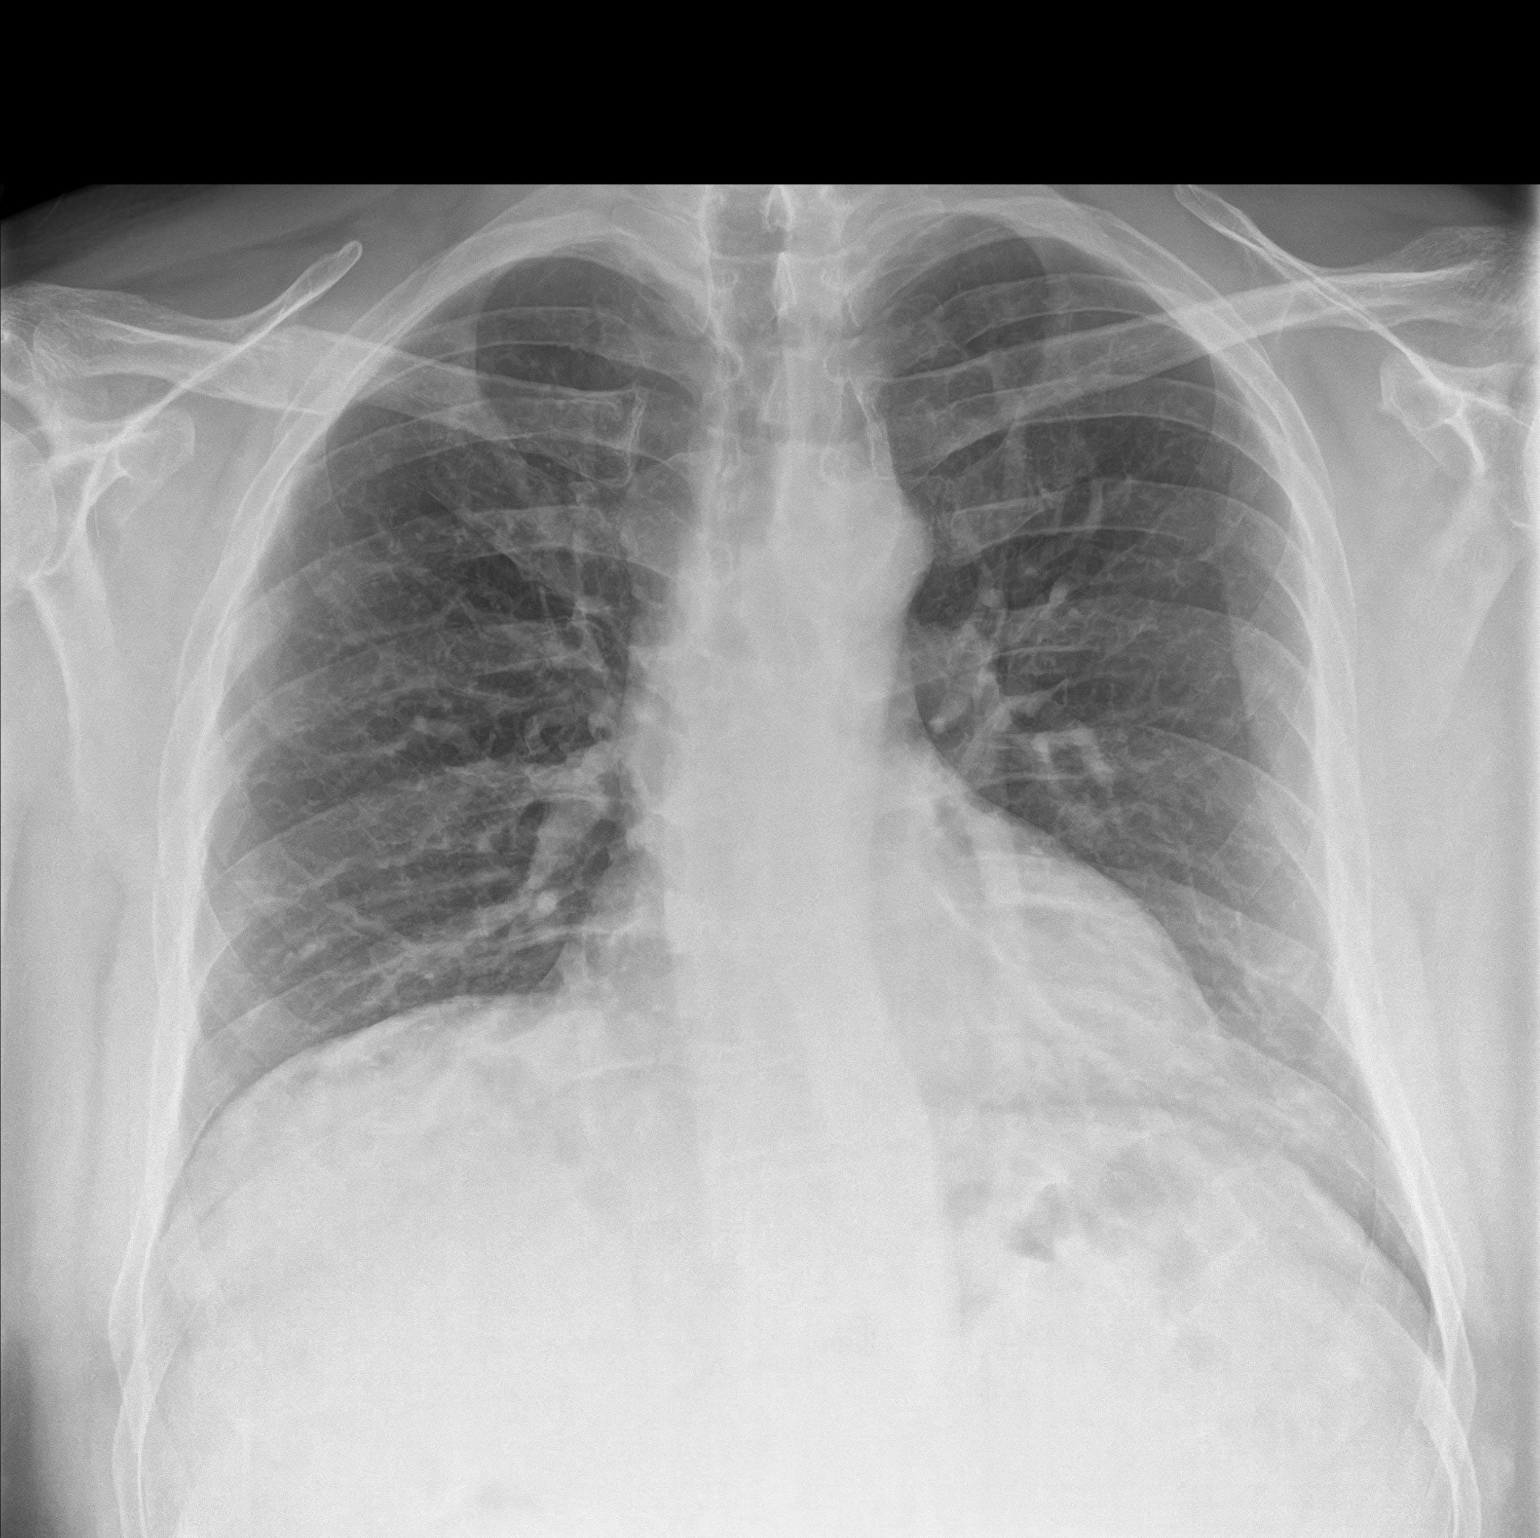
[im 2/2]
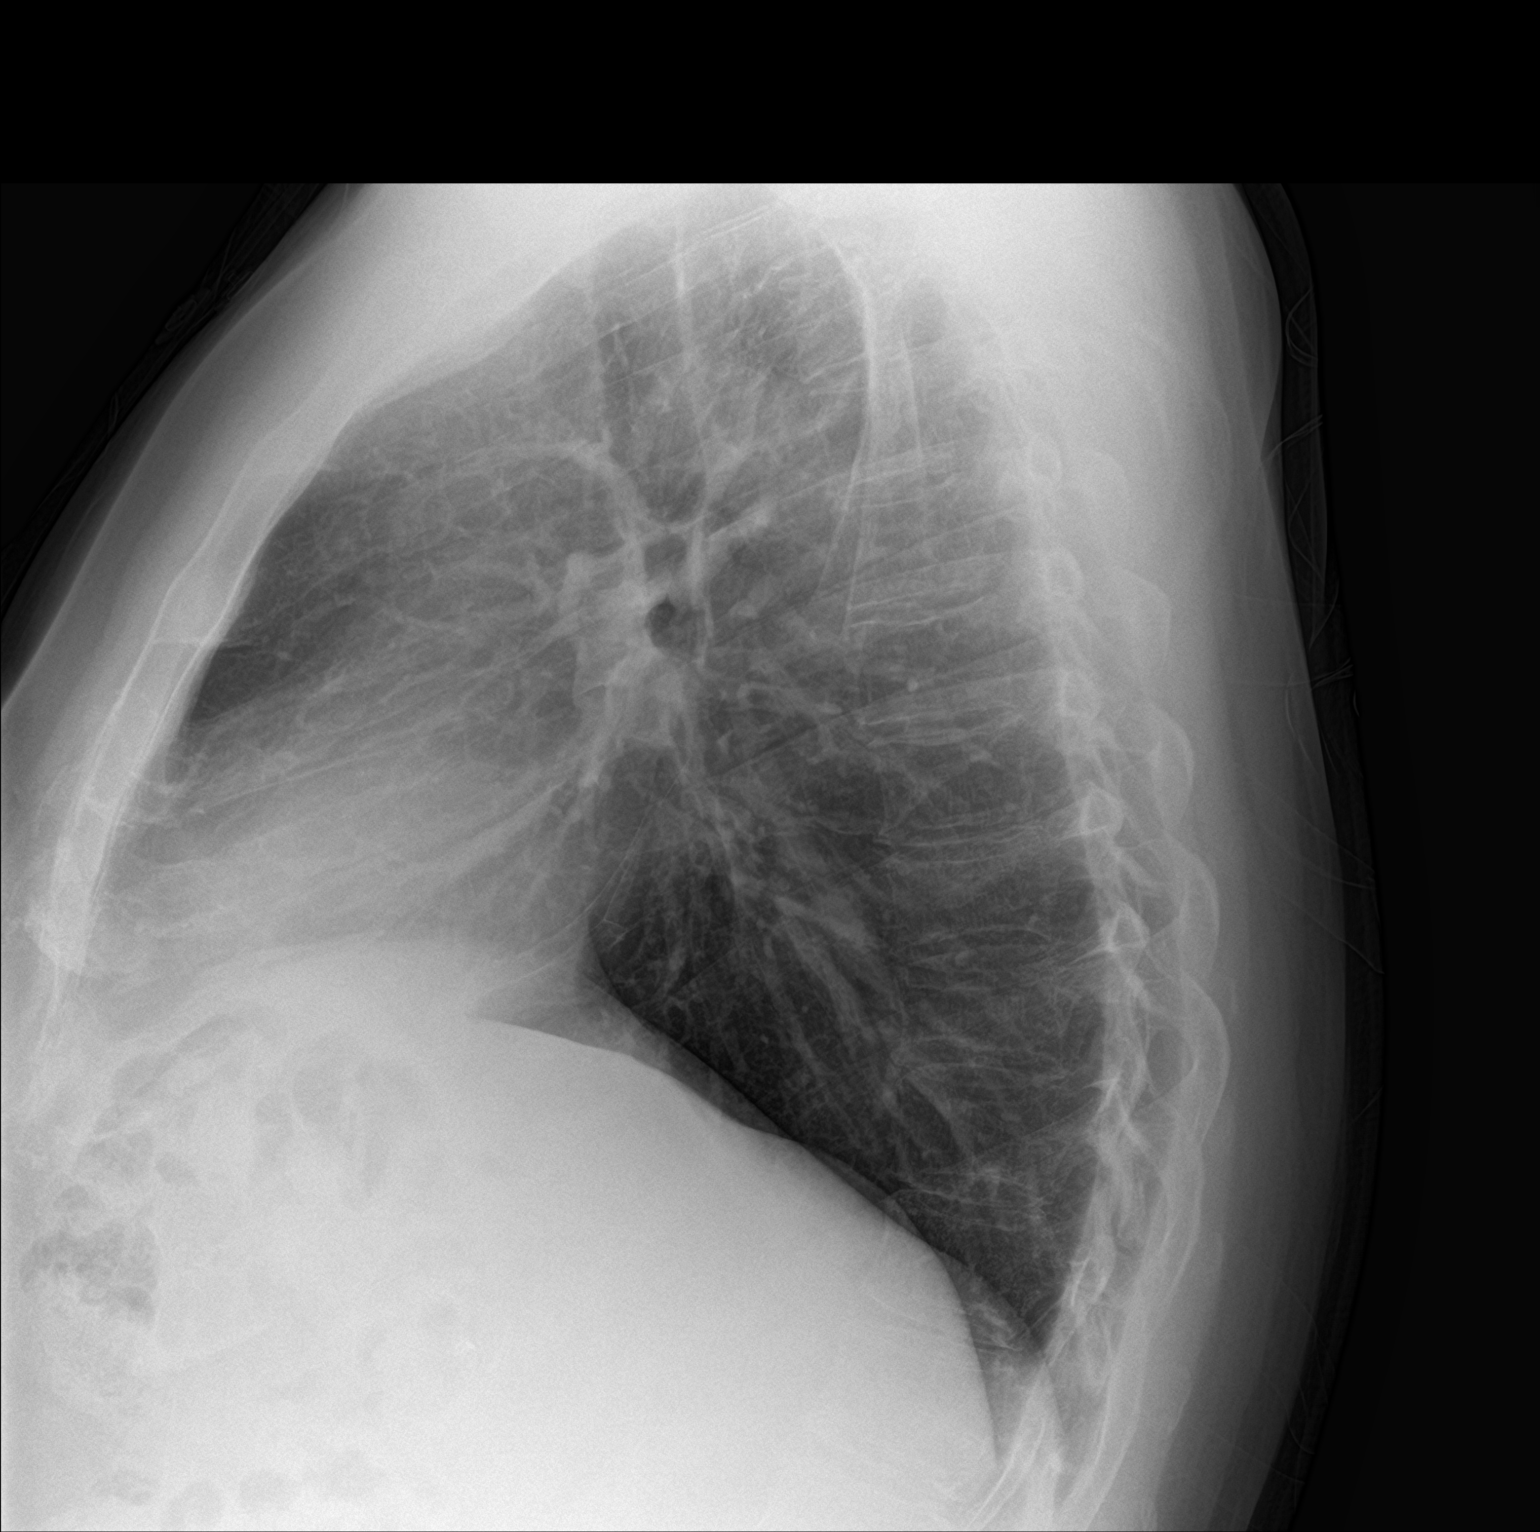

[2 of 2 positions shown; findings below may reference images not displayed]

FINDINGS: Lung volumes and mediastinal contours are stable since 5315 and
within normal limits. Both lungs appear clear. No pneumothorax or
pleural effusion. Visualized tracheal air column is within normal
limits.

Negative visible bowel gas.

No acute osseous abnormality identified. Mild scoliosis. Chronic
right clavicle and right lateral rib deformities.
IMPRESSION: No acute cardiopulmonary abnormality.

## 2022-05-16 IMAGING — CT CT ANGIO CHEST
2 of 6 series · 18 of 46 positions shown · IV contrast (APPLIED)
Comparison: None.

CLINICAL DATA: Chest pain for several days.

EXAM:
CT ANGIOGRAPHY CHEST WITH CONTRAST
TECHNIQUE: Multidetector CT imaging of the chest was performed using the
standard protocol during bolus administration of intravenous
contrast. Multiplanar CT image reconstructions and MIPs were
obtained to evaluate the vascular anatomy.

[Series 6: thins · axial · 0.80mm/px · z∈[-635,-349]mm · 15 of 449 slices shown]
[im 20/449  lung]
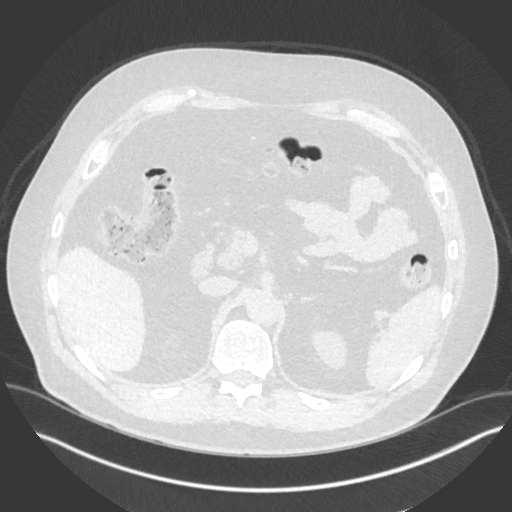
[im 59/449  soft-tissue]
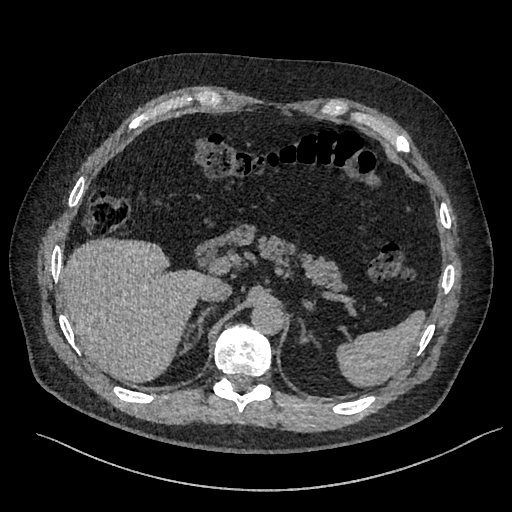
[im 78/449  lung]
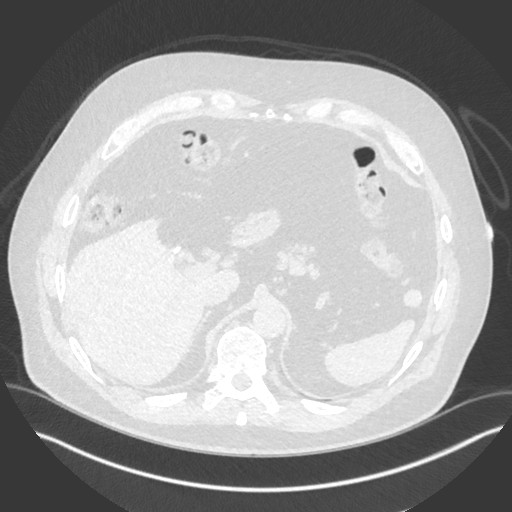
[im 117/449  soft-tissue]
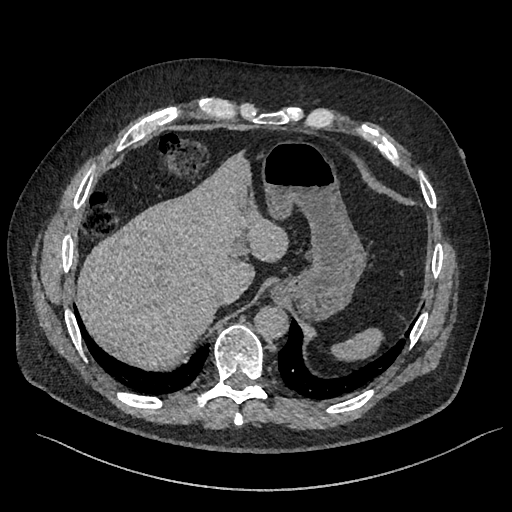
[im 137/449  lung]
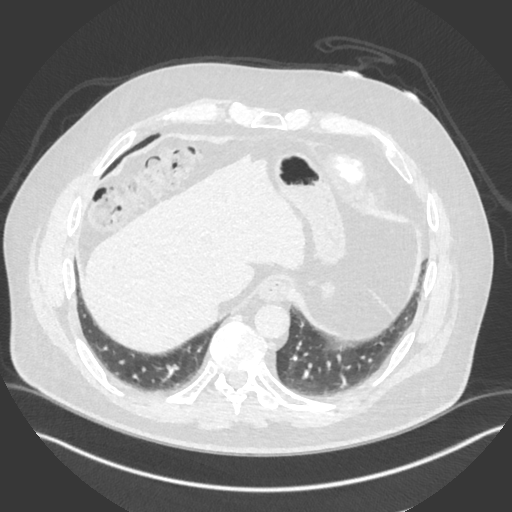
[im 176/449  soft-tissue]
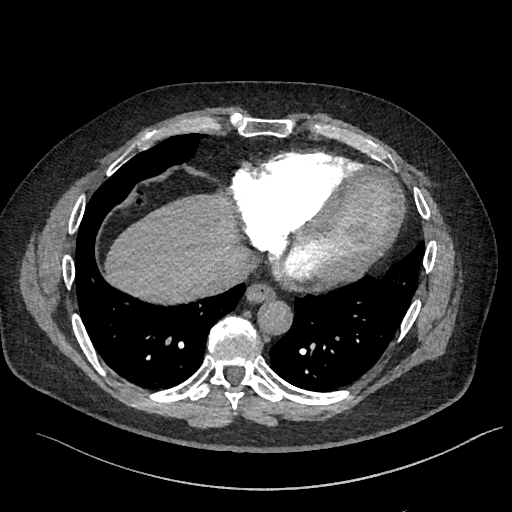
[im 195/449  lung]
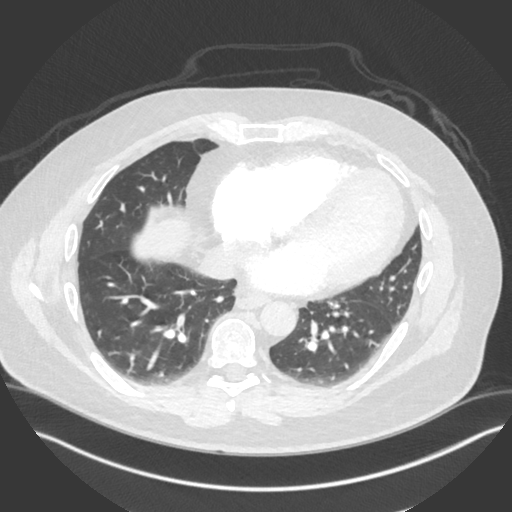
[im 234/449  soft-tissue]
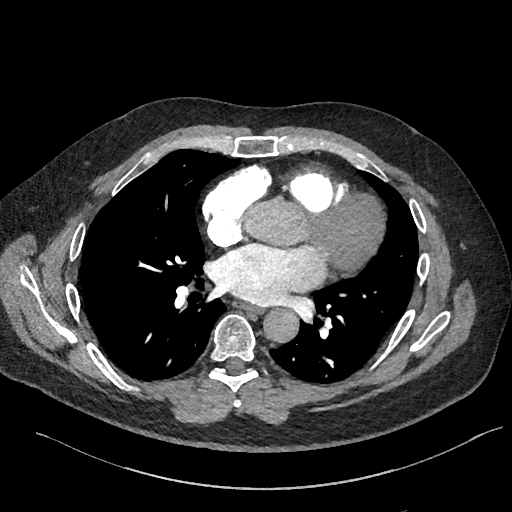
[im 254/449  lung]
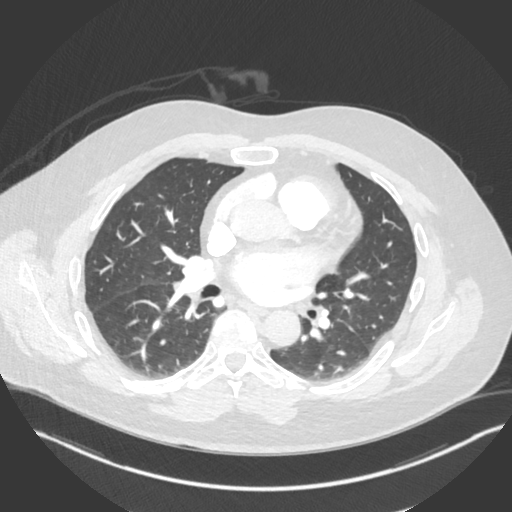
[im 273/449  soft-tissue]
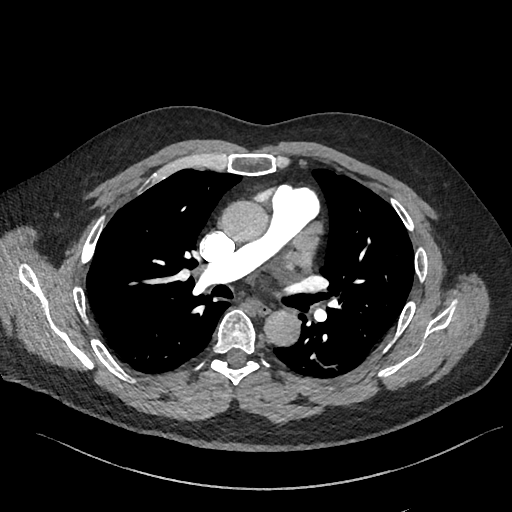
[im 312/449  lung]
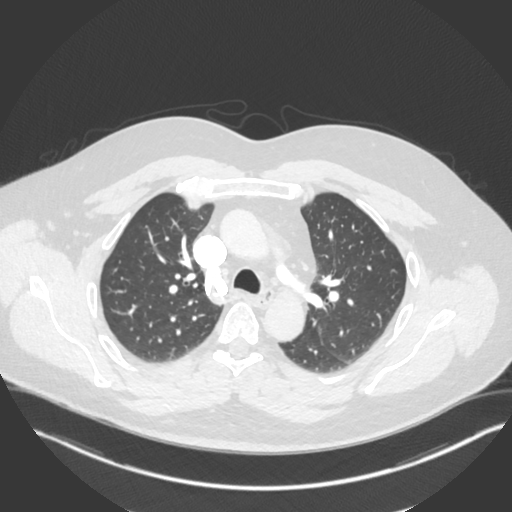
[im 332/449  soft-tissue]
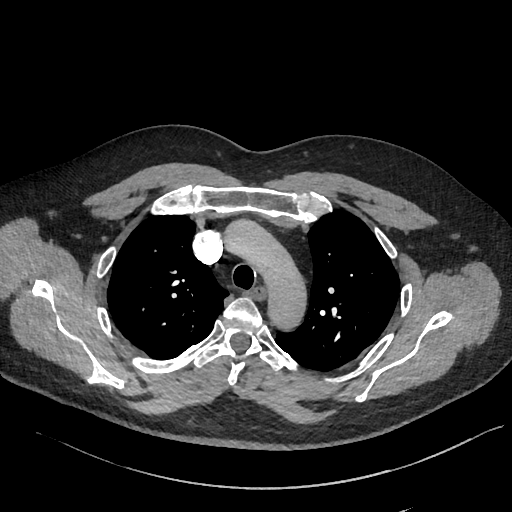
[im 371/449  lung]
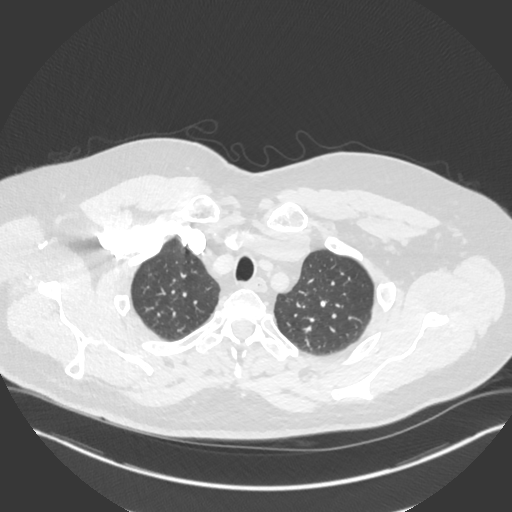
[im 390/449  soft-tissue]
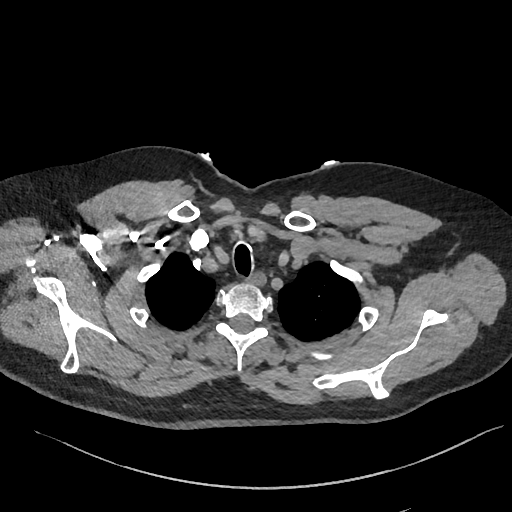
[im 429/449  lung]
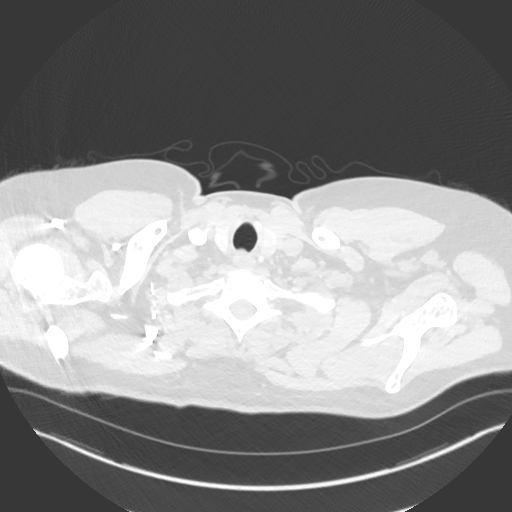

[Series 7: cor · coronal · 0.60mm/px · 3 of 156 slices shown]
[im 39/156  soft-tissue]
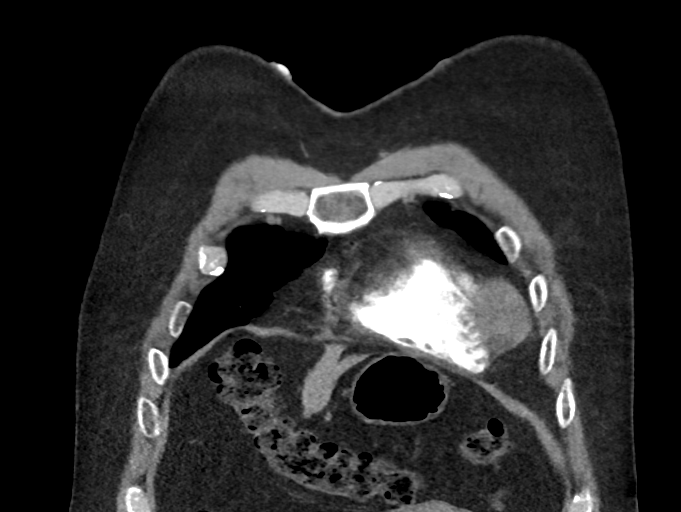
[im 78/156  soft-tissue]
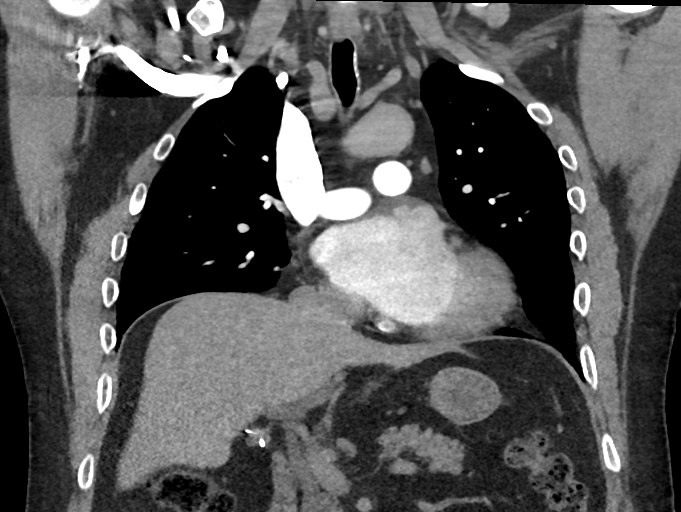
[im 117/156  soft-tissue]
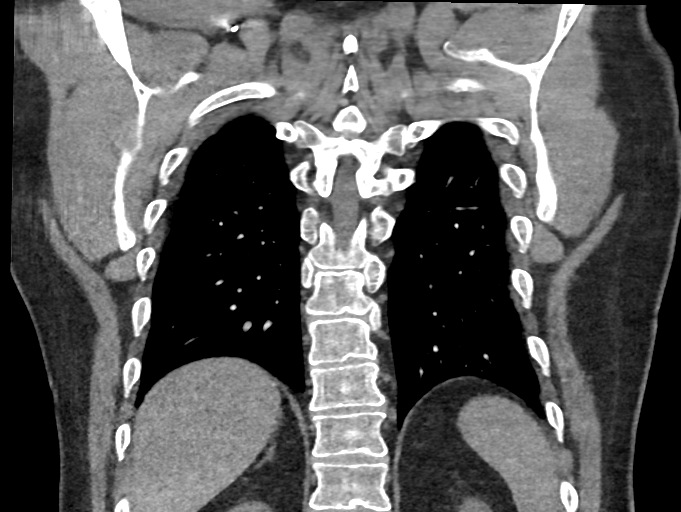

[18 of 46 positions shown; findings below may reference images not displayed]

RADIATION DOSE REDUCTION: This exam was performed according to the
departmental dose-optimization program which includes automated
exposure control, adjustment of the mA and/or kV according to
patient size and/or use of iterative reconstruction technique.

CONTRAST:  75mL OMNIPAQUE IOHEXOL 350 MG/ML SOLN
FINDINGS: Cardiovascular: The heart size is normal. No substantial pericardial
effusion. No thoracic aortic aneurysm. There is no filling defect
within the opacified pulmonary arteries to suggest the presence of
an acute pulmonary embolus.

Mediastinum/Nodes: No mediastinal lymphadenopathy. There is no hilar
lymphadenopathy. The esophagus has normal imaging features. There is
no axillary lymphadenopathy.

Lungs/Pleura: No focal airspace consolidation. No suspicious
pulmonary nodule or mass. No pleural effusion.

Upper Abdomen: Unremarkable.

Musculoskeletal: No worrisome lytic or sclerotic osseous
abnormality.

Review of the MIP images confirms the above findings.
IMPRESSION: 1. No CT evidence for acute pulmonary embolus.
2. No acute findings in the chest.

## 2022-05-29 ENCOUNTER — Other Ambulatory Visit: Payer: Self-pay | Admitting: Neurology

## 2022-05-29 DIAGNOSIS — R519 Headache, unspecified: Secondary | ICD-10-CM

## 2022-06-08 ENCOUNTER — Ambulatory Visit
Admission: RE | Admit: 2022-06-08 | Discharge: 2022-06-08 | Disposition: A | Discharge status: Home / Self Care | Payer: BC Managed Care – PPO | Source: Ambulatory Visit | Attending: Neurology | Admitting: Neurology

## 2022-06-08 DIAGNOSIS — R519 Headache, unspecified: Secondary | ICD-10-CM | POA: Insufficient documentation

## 2022-06-08 MED ORDER — GADOBUTROL 1 MMOL/ML IV SOLN
10.0000 mL | Freq: Once | INTRAVENOUS | Status: AC | PRN
  Administered 2022-06-08: 10 mL via INTRAVENOUS

## 2022-07-05 ENCOUNTER — Ambulatory Visit
Payer: BC Managed Care – PPO | Attending: Student in an Organized Health Care Education/Training Program | Admitting: Student in an Organized Health Care Education/Training Program

## 2022-07-05 ENCOUNTER — Encounter: Payer: Self-pay | Admitting: Student in an Organized Health Care Education/Training Program

## 2022-07-05 VITALS — BP 137/91 | HR 83 | Temp 97.0°F | Resp 18 | Ht 75.0 in | Wt 275.0 lb

## 2022-07-05 DIAGNOSIS — G9600 Cerebrospinal fluid leak, unspecified: Secondary | ICD-10-CM | POA: Diagnosis present

## 2022-07-05 DIAGNOSIS — G8929 Other chronic pain: Secondary | ICD-10-CM | POA: Diagnosis present

## 2022-07-05 DIAGNOSIS — R519 Headache, unspecified: Secondary | ICD-10-CM | POA: Insufficient documentation

## 2022-07-05 NOTE — Progress Notes (Signed)
Safety precautions to be maintained throughout the outpatient stay will include: orient to surroundings, keep bed in low position, maintain call bell within reach at all times, provide assistance with transfer out of bed and ambulation.  

## 2022-07-05 NOTE — Progress Notes (Signed)
Patient: Mark Bates  Service Category: E/M  Provider: Gillis Santa, MD  DOB: [birth year]  DOS: 07/05/2022  Referring Provider: Maryland Pink, MD  MRN: [id number]  Setting: Ambulatory outpatient  PCP: Maryland Pink, MD  Type: New Patient  Specialty: Interventional Pain Management    Location: Office  Delivery: Face-to-face     Primary Reason(s) for Visit: Encounter for initial evaluation of one or more chronic problems (new to examiner) potentially causing chronic pain, and posing a threat to normal musculoskeletal function. (Level of risk: High) CC: Headache  HPI  [name] is a 55 y.o. year old, male patient, who comes for the first time to our practice referred by Maryland Pink, MD for our initial evaluation of his chronic pain. He has Irritable bowel syndrome without diarrhea; GERD (gastroesophageal reflux disease); Impingement syndrome of shoulder region; Derangement of knee; Adhesive capsulitis of shoulder; Biceps tendinitis; and Abnormal gait on their problem list. Today he comes in for evaluation of his Headache  Pain Assessment: Location:   Head Radiating: radiates from mid right eyebrow up Onset: More than a month ago Duration: Chronic pain Quality: Stabbing, Pressure Severity: 3 /10 (subjective, self-reported pain score)  Effect on ADL: Limtis activities Timing: Constant (worse when coughing, sneezing, bending lying ddown) Modifying factors: lying down after a period of time BP: (!) 137/91  HR: 83  Onset and Duration: Gradual Cause of pain: Unknown Severity: No change since onset, NAS-11 at its worse: 9/10, NAS-11 at its best: 2/10, NAS-11 now: 3/10, and NAS-11 on the average: 4/10 Timing: Afternoon, Night, and After activity or exercise Aggravating Factors: Bending Alleviating Factors: lying down, resting Associated Problems: denies Quality of Pain: Constant and Stabbing Previous Examinations or Tests: MRI scan, X-rays, and Neurological evaluation Previous Treatments:  The patient denies -no treatments noted  Mark Bates is being referred here from Dr. Brigitte Pulse to consider a lumbar epidural blood patch given concern for spontaneous CSF leak.  Of note, Mark Bates has been dealing with chronic persistent frontotemporal headaches since May 2023.  His pain and headache is exacerbated by sneezing coughing bending and any other activities or behavior that increase intra-abdominal or intrathoracic pressure.  The symptoms get better when he is supine.  No papilledema.  Slight photosensitivity.  No history of coagulopathy or bleeding dyscrasias.  He is not on any blood thinners.  Meds   Current Outpatient Medications:    amLODipine (NORVASC) 5 MG tablet, Take 5 mg by mouth at bedtime. , Disp: , Rfl:    baclofen (LIORESAL) 10 MG tablet, Take 10 mg by mouth 2 (two) times daily., Disp: , Rfl:    cetirizine (ZYRTEC) 10 MG tablet, Take 10 mg by mouth at bedtime. , Disp: , Rfl:    dexlansoprazole (DEXILANT) 60 MG capsule, Take 60 mg by mouth daily., Disp: , Rfl:    dicyclomine (BENTYL) 10 MG capsule, Take by mouth., Disp: , Rfl:    fluticasone (FLONASE) 50 MCG/ACT nasal spray, Place 2 sprays into both nostrils daily as needed for rhinitis. , Disp: , Rfl:    lisinopril (ZESTRIL) 5 MG tablet, Take 5 mg by mouth in the morning and at bedtime., Disp: , Rfl:    Multiple Vitamin (MULTIVITAMIN WITH MINERALS) TABS tablet, Take 1 tablet by mouth daily., Disp: , Rfl:    OVER THE COUNTER MEDICATION, Take 1 capsule by mouth daily. Pt takes IBgard., Disp: , Rfl:    Probiotic Product (PHILLIPS COLON HEALTH) CAPS, Take 1 capsule by mouth daily., Disp: , Rfl:  hyoscyamine (LEVSIN) 0.125 MG/5ML ELIX, Take 0.125 mg by mouth every 4 (four) hours as needed. (Patient not taking: Reported on 07/05/2022), Disp: , Rfl:    ibandronate (BONIVA) 150 MG tablet, Take 150 mg by mouth every 30 (thirty) days. Take in the morning with a full glass of water, on an empty stomach, and do not take anything else by mouth or lie  down for the next 30 min. (Patient not taking: Reported on 07/05/2022), Disp: , Rfl:    meloxicam (MOBIC) 7.5 MG tablet, Take 7.5 mg by mouth daily. (Patient not taking: Reported on 07/05/2022), Disp: , Rfl:    predniSONE (STERAPRED UNI-PAK 21 TAB) 10 MG (21) TBPK tablet, Take  6 tablets by mouth  Today and 5 tablets tomorrow then one tablet  less each day there after, take with food. (Patient not taking: Reported on 01/01/2020), Disp: 21 tablet, Rfl: 0   Wheat Dextrin (BENEFIBER DRINK MIX PO), Take 1 packet by mouth daily. (Patient not taking: Reported on 07/05/2022), Disp: , Rfl:   Imaging Review   (06/08/2022) MRI brain with and without contrast  No evidence of acute intracranial abnormality. Mild multifocal T2 FLAIR hyperintense signal abnormality within the cerebral white matter, progressed from the prior MRI of 09/21/2015. These signal changes are nonspecific, but most often secondary to chronic small vessel ischemia. Alternative considerations include sequela of chronic migraine headaches or  sequela of a prior infectious/inflammatory process, among others. Mucous retention cysts within the right maxillary sinus, measuring up to 13 mm.   ROS  Cardiovascular: High blood pressure Pulmonary or Respiratory: No reported pulmonary signs or symptoms such as wheezing and difficulty taking a deep full breath (Asthma), difficulty blowing air out (Emphysema), coughing up mucus (Bronchitis), persistent dry cough, or temporary stoppage of breathing during sleep Neurological: No reported neurological signs or symptoms such as seizures, abnormal skin sensations, urinary and/or fecal incontinence, being born with an abnormal open spine and/or a tethered spinal cord Psychological-Psychiatric: No reported psychological or psychiatric signs or symptoms such as difficulty sleeping, anxiety, depression, delusions or hallucinations (schizophrenial), mood swings (bipolar disorders) or suicidal ideations or  attempts Gastrointestinal: No reported gastrointestinal signs or symptoms such as vomiting or evacuating blood, reflux, heartburn, alternating episodes of diarrhea and constipation, inflamed or scarred liver, or pancreas or irrregular and/or infrequent bowel movements Genitourinary: No reported renal or genitourinary signs or symptoms such as difficulty voiding or producing urine, peeing blood, non-functioning kidney, kidney stones, difficulty emptying the bladder, difficulty controlling the flow of urine, or chronic kidney disease Hematological: No reported hematological signs or symptoms such as prolonged bleeding, low or poor functioning platelets, bruising or bleeding easily, hereditary bleeding problems, low energy levels due to low hemoglobin or being anemic Endocrine: No reported endocrine signs or symptoms such as high or low blood sugar, rapid heart rate due to high thyroid levels, obesity or weight gain due to slow thyroid or thyroid disease Rheumatologic: No reported rheumatological signs and symptoms such as fatigue, joint pain, tenderness, swelling, redness, heat, stiffness, decreased range of motion, with or without associated rash Musculoskeletal: Negative for myasthenia gravis, muscular dystrophy, multiple sclerosis or malignant hyperthermia Work History: Working full time  Allergies  [name] has No Known Allergies.  Laboratory Chemistry Profile   Renal Lab Results  Component Value Date   BUN 14 02/28/2022   CREATININE 1.11 02/28/2022   GFRAA >60 06/29/2020   GFRNONAA >60 02/28/2022   PROTEINUR NEGATIVE 06/29/2020     Electrolytes Lab Results  Component Value  Date   NA 141 02/28/2022   K 3.8 02/28/2022   CL 107 02/28/2022   CALCIUM 9.1 02/28/2022     Hepatic Lab Results  Component Value Date   AST 24 02/28/2022   ALT 30 02/28/2022   ALBUMIN 4.2 02/28/2022   ALKPHOS 113 02/28/2022     ID Lab Results  Component Value Date   SARSCOV2NAA Not Detected  12/31/2020     Bone No results found for: "VD25OH", "VD125OH2TOT", "EH2094BS9", "GG8366QH4", "25OHVITD1", "25OHVITD2", "25OHVITD3", "TESTOFREE", "TESTOSTERONE"   Endocrine Lab Results  Component Value Date   GLUCOSE 114 (H) 02/28/2022   GLUCOSEU NEGATIVE 06/29/2020     Neuropathy No results found for: "VITAMINB12", "FOLATE", "HGBA1C", "HIV"   CNS No results found for: "COLORCSF", "APPEARCSF", "RBCCOUNTCSF", "WBCCSF", "POLYSCSF", "LYMPHSCSF", "EOSCSF", "PROTEINCSF", "GLUCCSF", "JCVIRUS", "CSFOLI", "IGGCSF", "LABACHR", "ACETBL"   Inflammation (CRP: Acute  ESR: Chronic) Lab Results  Component Value Date   ESRSEDRATE 5 02/09/2014     Rheumatology No results found for: "RF", "ANA", "LABURIC", "URICUR", "LYMEIGGIGMAB", "LYMEABIGMQN", "HLAB27"   Coagulation Lab Results  Component Value Date   INR 1.0 02/09/2014   LABPROT 12.8 02/09/2014   APTT 28.1 02/09/2014   PLT 226 02/28/2022     Cardiovascular Lab Results  Component Value Date   TROPONINI <0.03 08/20/2015   HGB 14.6 02/28/2022   HCT 44.3 02/28/2022     Screening Lab Results  Component Value Date   SARSCOV2NAA Not Detected 12/31/2020     Cancer No results found for: "CEA", "CA125", "LABCA2"   Allergens No results found for: "ALMOND", "APPLE", "ASPARAGUS", "AVOCADO", "BANANA", "BARLEY", "BASIL", "BAYLEAF", "GREENBEAN", "LIMABEAN", "WHITEBEAN", "BEEFIGE", "REDBEET", "BLUEBERRY", "BROCCOLI", "CABBAGE", "MELON", "CARROT", "CASEIN", "CASHEWNUT", "CAULIFLOWER", "CELERY"     Note: Lab results reviewed.  [place]  Drug: [name]  reports no history of drug use. Alcohol:  reports current alcohol use. Tobacco:  reports that he quit smoking about 35 years ago. His smoking use included cigarettes. He has never used smokeless tobacco. Medical:  has a past medical history of Depression, Diverticulitis, Esophageal motility disorder, Gastritis, GERD (gastroesophageal reflux disease), Hypertension, Intestinal metaplasia of  gastric mucosa, Irregular Z line of esophagus, and Irritable bowel syndrome (IBS). Family: family history includes Alcohol abuse in his sister; Coronary artery disease in his paternal grandfather; Hyperlipidemia in his paternal grandfather; Hypertension in his brother and father; Pulmonary fibrosis in his father.  Past Surgical History:  Procedure Laterality Date   CHOLECYSTECTOMY     COLONOSCOPY     ESOPHAGOGASTRODUODENOSCOPY (EGD) WITH PROPOFOL N/A 07/23/2015   Procedure: ESOPHAGOGASTRODUODENOSCOPY (EGD) WITH PROPOFOL;  Surgeon: Lollie Sails, MD;  Location: [facility] ENDOSCOPY;  Service: Endoscopy;  Laterality: N/A;   ESOPHAGOGASTRODUODENOSCOPY (EGD) WITH PROPOFOL N/A 12/27/2018   Procedure: ESOPHAGOGASTRODUODENOSCOPY (EGD) WITH PROPOFOL;  Surgeon: Lollie Sails, MD;  Location: [facility] ENDOSCOPY;  Service: Endoscopy;  Laterality: N/A;   GALLBLADDER SURGERY     KNEE ARTHROSCOPY Bilateral X3   WRIST SURGERY     Active Ambulatory Problems    Diagnosis Date Noted   Irritable bowel syndrome without diarrhea 10/08/2014   GERD (gastroesophageal reflux disease) 08/06/2017   Impingement syndrome of shoulder region 08/06/2017   Derangement of knee 08/06/2017   Adhesive capsulitis of shoulder 08/06/2017   Biceps tendinitis 08/06/2017   Abnormal gait 08/06/2017   Resolved Ambulatory Problems    Diagnosis Date Noted   No Resolved Ambulatory Problems   Past Medical History:  Diagnosis Date   Depression    Diverticulitis    Esophageal motility disorder  Gastritis    Hypertension    Intestinal metaplasia of gastric mucosa    Irregular Z line of esophagus    Irritable bowel syndrome (IBS)    Constitutional Exam  General appearance: Well nourished, well developed, and well hydrated. In no apparent acute distress Vitals:   07/05/22 1355  BP: (!) 137/91  Pulse: 83  Resp: 18  Temp: (!) 97 F (36.1 C)  SpO2: 98%  Weight: 275 lb (124.7 kg)  Height: _0  (1.905 m)   BMI Assessment:  Estimated body mass index is 34.37 kg/m as calculated from the following:   Height as of this encounter: _1  (1.905 m).   Weight as of this encounter: 275 lb (124.7 kg).  BMI interpretation table: BMI level Category Range association with higher incidence of chronic pain  <18 kg/m2 Underweight   18.5-24.9 kg/m2 Ideal body weight   25-29.9 kg/m2 Overweight Increased incidence by 20%  30-34.9 kg/m2 Obese (Class I) Increased incidence by 68%  35-39.9 kg/m2 Severe obesity (Class II) Increased incidence by 136%  >40 kg/m2 Extreme obesity (Class III) Increased incidence by 254%   Patient's current BMI Ideal Body weight  Body mass index is 34.37 kg/m. Ideal body weight: 84.5 kg (186 lb 4.6 oz) Adjusted ideal body weight: 100.6 kg (221 lb 12.4 oz)   BMI Readings from Last 4 Encounters:  07/05/22 34.37 kg/m  02/28/22 34.37 kg/m  06/29/20 31.87 kg/m  01/08/20 35.12 kg/m   Wt Readings from Last 4 Encounters:  07/05/22 275 lb (124.7 kg)  02/28/22 275 lb (124.7 kg)  06/29/20 (!) 255 lb (115.7 kg)  01/08/20 281 lb (127.5 kg)    Psych/Mental status: Alert, oriented x 3 (person, place, & time)       Eyes: PERLA Respiratory: No evidence of acute respiratory distress  5 out of 5 strength bilateral upper extremity: Shoulder abduction, elbow flexion, elbow extension, thumb extension.   Assessment  Primary Diagnosis & Pertinent Problem List: The primary encounter diagnosis was CSF leak (Spontaneous dx by Dr Manuella Ghazi). A diagnosis of Chronic nonintractable headache, unspecified headache type was also pertinent to this visit.  Visit Diagnosis (New problems to examiner): 1. CSF leak (Spontaneous dx by Dr Manuella Ghazi)   2. Chronic nonintractable headache, unspecified headache type    Plan of Care     Details of the procedure were reviewed with the patient along with his wife.  I explained to him what a lumbar epidural blood patch would entail.  Given his size and body habitus, I would like to  ideally inject 15 to 20 cc into his epidural space.  I did caution him regarding the risk of an increase in his low back pain, a radicular pain flare, lower extremity weakness, increase in his overall pain with the procedure.  He accepts these risks and is willing to try it.  All questions were answered to his satisfaction.  He is in agreement with plan of moving forward with lumbar epidural blood patch.   Procedure Orders         EPIDURAL BLOOD PATCH     Orders Placed This Encounter  Procedures   EPIDURAL BLOOD PATCH    Standing Status:   Future    Standing Expiration Date:   07/06/2023    Scheduling Instructions:     Lumbar epidural blood patch for presumed CSF leak.    Order Specific Question:   Where will this procedure be performed?    Answer:   [facility]  Provider-requested follow-up: Return in about 5 days (around 07/10/2022) for Blood patch for presumed CSF leak. I spent a total of 60 minutes reviewing chart data, face-to-face evaluation with the patient, counseling and coordination of care as detailed above.  Future Appointments  Date Time Provider [place]  07/10/2022  1:20 PM Gillis Santa, MD [facility] None    Note by: Gillis Santa, MD Date: 07/05/2022; Time: 3:12 PM

## 2022-07-05 NOTE — Patient Instructions (Signed)
Epidural Blood Patch for Spinal Headache An epidural blood patch is a procedure that is used to treat a headache that happens when there is a leak of spinal fluid. This type of headache is called a spinal headache or a post-dural puncture headache. Spinal headaches sometimes happen after a person has had a lumbar puncture or epidural anesthesia. These are procedures in which a needle is passed between the bones of the spine. Generally, an epidural blood patch is done when a spinal headache has not been helped by 2-3 days of: Resting in bed. Drinking lots of fluids. Taking medicines by mouth for pain. In the epidural blood patch procedure, a small amount of your blood is removed and then injected into the area of the spinal fluid leak. The blood will form a clot to seal the leak. Tell a health care provider about: Any allergies you have. All medicines you are taking, including vitamins, herbs, eye drops, creams, and over-the-counter medicines. Any problems you or family members have had with anesthetic medicines. Any bleeding problems you have. Any surgeries you have had. Any medical conditions you have. Whether you are pregnant or may be pregnant. What are the risks? Generally, this is a safe procedure. However, problems may occur, including: Backache. Nerve pain, tingling, or numbness. Bleeding. Infection. Allergic reactions to medicines or dyes. Damage to nearby structures or organs. There may be added health problems in people who have bleeding disorders or infections. An epidural blood patch is not done when: Your headache is caused by an infection in your lumbar area or in your blood. You tend to bleed too much (have bleeding tendencies or bleeding disorders). You are taking blood thinners. What happens before the procedure? Medicines Ask your health care provider about: Changing or stopping your regular medicines. This is especially important if you are taking diabetes medicines  or blood thinners. Taking medicines such as aspirin and ibuprofen. These medicines can thin your blood. Do not take these medicines unless your health care provider tells you to take them. Taking over-the-counter medicines, vitamins, herbs, and supplements. General instructions Ask your health care provider: How your injection site will be marked. What steps will be taken to help prevent infection. These may include: Removing hair at the injection site. Washing skin with a germ-killing soap. Receiving antibiotic medicine. If you will be going home right after the procedure, plan to have a responsible adult: Take you home from the hospital or clinic. You will not be allowed to drive. Care for you for the time you are told. Do not use any products that contain nicotine or tobacco for at least 4 weeks before the procedure. These products include cigarettes, chewing tobacco, and vaping devices, such as e-cigarettes. If you need help quitting, ask your health care provider. When to stop eating and drinking Follow instructions from your health care provider about what you may eat and drink before your procedure. These may include: 8 hours before the procedure Stop eating most foods. Do not eat meat, fried foods, or fatty foods. Eat only light foods, such as toast or crackers. All liquids are okay except energy drinks and alcohol. 6 hours before the procedure Stop eating. Drink only clear liquids, such as water, clear fruit juice, black coffee, plain tea, and sports drinks. Do not drink energy drinks or alcohol. 2 hours before the procedure Stop drinking all liquids. You may be allowed to take medicine with small sips of water.  What happens during the procedure?  IVs will be  inserted into two of your veins. One IV will give you fluids and medicines during the procedure. The other IV will be used to take blood. You will be asked to lie on your abdomen. You will be given one or more of the  following: A medicine to help you relax (sedative). A medicine to numb the area (local anesthetic). An X-ray machine will be used to take images of your back to show where the leaking is. A dye may be injected so the area can be seen well on an X-ray, or an ultrasound may be done to find the area. Blood will be drawn from your arm. The blood will be injected into the place in your body where the leak is. When the blood is injected, you may feel tightness in your buttocks, lower back, or thighs. The IVs will be removed. A bandage (dressing) will be put over the area where the IVs were. The procedure may vary among health care providers and hospitals What happens after the procedure?  Your blood pressure, heart rate, breathing rate, and blood oxygen level will be monitored until you leave the hospital or clinic Your headache may be gone almost right away, or the headache may go away more slowly over the next 24 hours. You will be asked to lie on your back for 2-4 hours with some pillows under your knees. It is important to lie still while on your back so a good blood clot can form. Avoid any straining, especially right after the procedure. This includes straining while using the toilet. If you were given a sedative during the procedure, it can affect you for several hours. Do not drive or operate machinery until your health care provider says that it is safe. Summary An epidural blood patch is a procedure that is used to treat a headache that happens when there is a leak of spinal fluid. Spinal headaches sometimes happen after a person has had a lumbar puncture or epidural anesthesia. In the epidural blood patch procedure, a small amount of your blood is removed and is then injected into the area of the spinal fluid leak. The blood will form a clot to seal the leak. This information is not intended to replace advice given to you by your health care provider. Make sure you discuss any questions you  have with your health care provider. Document Revised: 07/15/2021 Document Reviewed: 07/15/2021 Elsevier Patient Education  2023 [employer].

## 2022-07-10 ENCOUNTER — Ambulatory Visit
Payer: BC Managed Care – PPO | Attending: Student in an Organized Health Care Education/Training Program | Admitting: Student in an Organized Health Care Education/Training Program

## 2022-07-10 ENCOUNTER — Encounter: Payer: Self-pay | Admitting: Student in an Organized Health Care Education/Training Program

## 2022-07-10 ENCOUNTER — Ambulatory Visit
Admission: RE | Admit: 2022-07-10 | Discharge: 2022-07-10 | Disposition: A | Discharge status: Home / Self Care | Payer: BC Managed Care – PPO | Source: Ambulatory Visit | Attending: Student in an Organized Health Care Education/Training Program | Admitting: Student in an Organized Health Care Education/Training Program

## 2022-07-10 VITALS — BP 116/66 | HR 73 | Temp 97.0°F | Resp 16 | Ht 75.0 in | Wt 275.0 lb

## 2022-07-10 DIAGNOSIS — G9600 Cerebrospinal fluid leak, unspecified: Secondary | ICD-10-CM | POA: Diagnosis present

## 2022-07-10 DIAGNOSIS — R519 Headache, unspecified: Secondary | ICD-10-CM | POA: Insufficient documentation

## 2022-07-10 DIAGNOSIS — G8929 Other chronic pain: Secondary | ICD-10-CM | POA: Insufficient documentation

## 2022-07-10 MED ORDER — GLYCOPYRROLATE 0.2 MG/ML IJ SOLN
INTRAMUSCULAR | Status: AC
  Filled 2022-07-10: qty 1

## 2022-07-10 MED ORDER — LIDOCAINE HCL 2 % IJ SOLN
20.0000 mL | Freq: Once | INTRAMUSCULAR | Status: AC
  Administered 2022-07-10: 400 mg
  Filled 2022-07-10: qty 20

## 2022-07-10 MED ORDER — IOHEXOL 180 MG/ML  SOLN
10.0000 mL | Freq: Once | INTRAMUSCULAR | Status: AC
  Administered 2022-07-10 (×2): 10 mL via EPIDURAL
  Filled 2022-07-10: qty 20

## 2022-07-10 NOTE — Progress Notes (Signed)
Safety precautions to be maintained throughout the outpatient stay will include: orient to surroundings, keep bed in low position, maintain call bell within reach at all times, provide assistance with transfer out of bed and ambulation.  

## 2022-07-10 NOTE — Progress Notes (Signed)
1125 Md in with patient, assessed BP, states that if it is below baseline to not take BP medication tonight, unable to get pant on, patient will wear short home with towel given for privacy

## 2022-07-10 NOTE — Progress Notes (Signed)
PROVIDER NOTE: Interpretation of information contained herein should be left to medically-trained personnel. Specific patient instructions are provided elsewhere under "Patient Instructions" section of medical record. This document was created in part using STT-dictation technology, any transcriptional errors that may result from this process are unintentional.  Patient: Mark Bates Type: Established DOB: [birth year] MRN: [id number] PCP: Jerl Mina, MD  Service: Procedure DOS: 07/10/2022 Setting: Ambulatory Location: Ambulatory outpatient facility Delivery: Face-to-face Provider: Edward Jolly, MD Specialty: Interventional Pain Management Specialty designation: 09 Location: Outpatient facility Ref. Prov.: Jerl Mina, MD     Primary Reason for Visit: Interventional Pain Management Treatment. CC: Pain (HEADACHE)  Referral from Dr. Sherryll Burger for lumbar epidural blood patch for presumed CSF leak   Interventional Treatment          Procedure: Epidural blood patch at L3/4 with 15 cc of heme injected Laterality: Midline Paramedial  Level: Lumbosacral   No./Series: Not applicable  Type: Epidural injection Purpose: Therapeutic  Region: Posterior Lumbosacral  Target Area: Posterior epidural space Approach: Percutaneous        Analgesic Technique:  Local Anesthesia only  Type: Local Anesthesia Local Anesthetic: Lidocaine 1-2% Sedation: None  Indication(s):  Analgesia Route: Infiltration ([place]/IM) IV Access: N/A         Position / Prep / Materials:  Position: Prone   Prep solution: DuraPrep (Iodine Povacrylex [0.7% available iodine] and Isopropyl Alcohol, 74% w/w)  Materials:       Tray: Epidural      Needle(s):  Type:  Epidural   Gauge (G):  17   Length:  3.5   Qty:  1    Prep Area: Entire  Lower  Lumbar  Area          1. CSF leak (Spontaneous dx by Dr Sherryll Burger)   2. Chronic nonintractable headache, unspecified headache type    NAS-11 Pain score:   Pre-procedure: 4 /10    Post-procedure: 1 /10   Pre-op H&P Assessment:  Mr. Mierzwa is a 56 y.o. (year old), male patient, seen today for interventional treatment. He  has a past surgical history that includes Knee arthroscopy (Bilateral, X3); Gallbladder surgery; Esophagogastroduodenoscopy (egd) with propofol (N/A, 07/23/2015); Cholecystectomy; Colonoscopy; Wrist surgery; and Esophagogastroduodenoscopy (egd) with propofol (N/A, 12/27/2018). Mr. Wandel has a current medication list which includes the following prescription(s): amlodipine, baclofen, cetirizine, dexlansoprazole, dicyclomine, fluticasone, lisinopril, multivitamin with minerals, OVER THE COUNTER MEDICATION, phillips colon health, hyoscyamine, ibandronate, meloxicam, prednisone, and wheat dextrin. His primarily concern today is the Pain (HEADACHE)  Initial Vital Signs:  Pulse/HCG Rate: 82ECG Heart Rate: 79 Temp:  (!) 97.2 F (36.2 C) Resp: 16 BP: 125/74 SpO2: 98 %  BMI: Estimated body mass index is 34.37 kg/m as calculated from the following:   Height as of this encounter: 6\' 3"  (1.905 m).   Weight as of this encounter: 275 lb (124.7 kg).  Risk Assessment: Allergies: Reviewed. He has No Known Allergies.  Allergy Precautions: None required Coagulopathies: Reviewed. None identified.  Blood-thinner therapy: None at this time Active Infection(s): Reviewed. None identified. Mr. Koerber is afebrile  Site Confirmation: Mr. Mcleish was asked to confirm the procedure and laterality before marking the site Procedure checklist: Completed Consent: Before the procedure and under the influence of no sedative(s), amnesic(s), or anxiolytics, the patient was informed of the treatment options, risks and possible complications. To fulfill our ethical and legal obligations, as recommended by the American Medical Association's Code of Ethics, I have informed the patient of my clinical impression; the nature and purpose  of the treatment or procedure; the risks, benefits,  and possible complications of the intervention; the alternatives, including doing nothing; the risk(s) and benefit(s) of the alternative treatment(s) or procedure(s); and the risk(s) and benefit(s) of doing nothing. The patient was provided information about the general risks and possible complications associated with the procedure. These may include, but are not limited to: failure to achieve desired goals, infection, bleeding, organ or nerve damage, allergic reactions, paralysis, and death. In addition, the patient was informed of those risks and complications associated to Spine-related procedures, such as failure to decrease pain; infection (i.e.: Meningitis, epidural or intraspinal abscess); bleeding (i.e.: epidural hematoma, subarachnoid hemorrhage, or any other type of intraspinal or peri-dural bleeding); organ or nerve damage (i.e.: Any type of peripheral nerve, nerve root, or spinal cord injury) with subsequent damage to sensory, motor, and/or autonomic systems, resulting in permanent pain, numbness, and/or weakness of one or several areas of the body; allergic reactions; (i.e.: anaphylactic reaction); and/or death. Furthermore, the patient was informed of those risks and complications associated with the medications. These include, but are not limited to: allergic reactions (i.e.: anaphylactic or anaphylactoid reaction(s)); adrenal axis suppression; blood sugar elevation that in diabetics may result in ketoacidosis or comma; water retention that in patients with history of congestive heart failure may result in shortness of breath, pulmonary edema, and decompensation with resultant heart failure; weight gain; swelling or edema; medication-induced neural toxicity; particulate matter embolism and blood vessel occlusion with resultant organ, and/or nervous system infarction; and/or aseptic necrosis of one or more joints. Finally, the patient was informed that Medicine is not an exact science; therefore,  there is also the possibility of unforeseen or unpredictable risks and/or possible complications that may result in a catastrophic outcome. The patient indicated having understood very clearly. We have given the patient no guarantees and we have made no promises. Enough time was given to the patient to ask questions, all of which were answered to the patient's satisfaction. Mr. Daphine DeutscherMartin has indicated that he wanted to continue with the procedure. Attestation: I, the ordering provider, attest that I have discussed with the patient the benefits, risks, side-effects, alternatives, likelihood of achieving goals, and potential problems during recovery for the procedure that I have provided informed consent. Date  Time: 07/10/2022  1:16 PM  Pre-Procedure Preparation:  Monitoring: As per clinic protocol. Respiration, ETCO2, SpO2, BP, heart rate and rhythm monitor placed and checked for adequate function Safety Precautions: Patient was assessed for positional comfort and pressure points before starting the procedure. Time-out: I initiated and conducted the "Time-out" before starting the procedure, as per protocol. The patient was asked to participate by confirming the accuracy of the "Time Out" information. Verification of the correct person, site, and procedure were performed and confirmed by me, the nursing staff, and the patient. "Time-out" conducted as per Joint Commission's Universal Protocol (UP.01.01.01). Time:     Description of Procedure:          Procedural Technique Safety Precautions: Aspiration looking for blood return was conducted prior to all injections. At no point did we inject any substances, as a needle was being advanced. No attempts were made at seeking any paresthesias. Safe injection practices and needle disposal techniques used. Medications properly checked for expiration dates. SDV (single dose vial) medications used. Description of the Procedure: Protocol guidelines were followed. The  patient was assisted into a comfortable position. The target area was identified and the area prepped in the usual manner. Skin & deeper tissues infiltrated  with local anesthetic. Appropriate amount of time allowed to pass for local anesthetics to take effect. The procedure needles were then advanced to the target area.  Once the epidural space at L3-L4 was accessed and confirmed via contrast confirmation, IV was established and 30 cc of heme was obtained.  The patient vasovagal at this time and his heart rate decreased to the 40s and he started saying that he was going to "pass out".  15 cc of heme were injected in 5 cc aliquots asking the patient if he felt excessive pressure in his legs or pain in his legs during injection.  Intramuscular glycopyrrolate 0.2 mg was given.  Procedure was completed and patient was turned over quickly onto stretcher.  His heart rate increased back to baseline to 69 and he started to feel better.  An IV was established and he received 500 cc of fluid.  5 out of 5 strength bilateral lower extremity: Plantar flexion, dorsiflexion, knee flexion, knee extension.   Vitals:   07/10/22 1403 07/10/22 1408 07/10/22 1413 07/10/22 1420  BP: 117/74 108/71 106/84 116/66  Pulse: 76 68 66 73  Resp: 16 18 16    Temp:      TempSrc:      SpO2: 99% 100% 98% 98%  Weight:      Height:         Start Time:   hrs. End Time:   hrs. Materials:  Needle(s) Type: Spinal Needle Gauge: 22G Length: 3.5-in Medication(s): Please see orders for medications and dosing details. vsMaterials:  Needle(s) Type: Epidural needle Gauge: 20G Length: 3.5-in Medication(s): Please see orders for medications and dosing details.  Imaging Guidance:          Type of Imaging Technique: Fluoroscopy Guidance (Spinal) Indication(s): N/A Exposure Time: No patient exposure Contrast: Before injecting any contrast, we confirmed that the patient did not have an allergy to iodine, shellfish, or radiological  contrast. Once satisfactory needle placement was completed at the desired level, radiological contrast was injected. Contrast injected under live fluoroscopy. No contrast complications. See chart for type and volume of contrast used. Fluoroscopic Guidance: I was personally present during the use of fluoroscopy. "Tunnel Vision Technique" used to obtain the best possible view of the target area. Parallax error corrected before commencing the procedure. "Direction-depth-direction" technique used to introduce the needle under continuous pulsed fluoroscopy. Once target was reached, antero-posterior, oblique, and lateral fluoroscopic projection used confirm needle placement in all planes. Images permanently stored in EMR. Ultrasound Guidance: N/A Interpretation: N/A  Post-operative Assessment:  Post-procedure Vital Signs:  Pulse/HCG Rate: 7376 Temp:  (!) 97 F (36.1 C) Resp: 16 BP: 116/66 SpO2: 98 %  EBL: None  Complications: As the epidural space was entered and while we were obtaining IV access and drawing heme, the patient vasovagal old and became bradycardic and was complaining of passing out.  Procedure was completed, he was given 0.2 mg of glycopyrrolate and turned over onto a stretcher and given blow-by oxygen which improved his vasovagal response.  Lower extremity strength.  Note:  other than the vasovagal response described above,  The patient tolerated the entire procedure well. A repeat set of vitals were taken after the procedure and the patient was kept under observation following institutional policy, for this type of procedure. Post-procedural neurological assessment was performed, showing return to baseline, prior to discharge. The patient was provided with post-procedure discharge instructions, including a section on how to identify potential problems. Should any problems arise concerning this procedure, the patient  was given instructions to immediately contact us, at any time, without  hesitation. In any case, we plan to contact the patient by telephone for a follow-up status report regarding this interventional procedure.  Comments:  No additional relevant information.  Plan of Care   Patient was doing well in the recovery room.  His heart rate and blood pressures had stabilized.  He was also endorsing improvement in his headaches.  His wife was with him at the bedside and I explained to her his vasovagal episode.  After fluids and glycopyrrolate, he has stabilized.  We will call him tomorrow to make sure that he is doing well.   Orders:  Orders Placed This Encounter  Procedures   DG PAIN CLINIC C-ARM 1-60 MIN NO REPORT    Intraoperative interpretation by procedural physician at [facility].    Standing Status:   Standing    Number of Occurrences:   1    Order Specific Question:   Reason for exam:    Answer:   Assistance in needle guidance and placement for procedures requiring needle placement in or near specific anatomical locations not easily accessible without such assistance.     Medications ordered for procedure: Meds ordered this encounter  Medications   lidocaine (XYLOCAINE) 2 % (with pres) injection 400 mg   iohexol (OMNIPAQUE) 180 MG/ML injection 10 mL    Must be Myelogram-compatible. If not available, you may substitute with a water-soluble, non-ionic, hypoallergenic, myelogram-compatible radiological contrast medium.   Medications administered: We administered lidocaine and iohexol.  See the medical record for exact dosing, route, and time of administration.  Follow-up plan:   Return in about 4 weeks (around 08/07/2022) for Post Procedure Evaluation, virtual.      Recent Visits Date Type Provider Dept  07/05/22 Office Visit Edward Jolly, MD [facility]  Showing recent visits within past 90 days and meeting all other requirements Today's Visits Date Type Provider Dept  07/10/22 Procedure visit Edward Jolly, MD [facility]  Showing today's visits and meeting all other requirements Future Appointments Date Type Provider Dept  08/14/22 Appointment Edward Jolly, MD [facility]  Showing future appointments within next 90 days and meeting all other requirements  Disposition: Discharge home  Discharge (Date  Time): 07/10/2022; 1431 hrs.   Primary Care Physician: Jerl Mina, MD Location: [facility] Note by: Edward Jolly, MD Date: 07/10/2022; Time: 2:38 PM  Disclaimer:  Medicine is not an exact science. The only guarantee in medicine is that nothing is guaranteed. It is important to note that the decision to proceed with this intervention was based on the information collected from the patient. The Data and conclusions were drawn from the patient's questionnaire, the interview, and the physical examination. Because the information was provided in large part by the patient, it cannot be guaranteed that it has not been purposely or unconsciously manipulated. Every effort has been made to obtain as much relevant data as possible for this evaluation. It is important to note that the conclusions that lead to this procedure are derived in large part from the available data. Always take into account that the treatment will also be dependent on availability of resources and existing treatment guidelines, considered by other Pain Management Practitioners as being common knowledge and practice, at the time of the intervention. For Medico-Legal purposes, it is also important to point out that variation in procedural techniques and pharmacological choices are the acceptable norm. The indications, contraindications, technique,  and results of the above procedure should only be interpreted and judged by a Board-Certified Interventional Pain Specialist with extensive familiarity and expertise in the same exact procedure and technique.

## 2022-07-10 NOTE — Patient Instructions (Signed)

## 2022-07-10 NOTE — Progress Notes (Signed)
During procedure, placement of epidural catheter and blood draw, heart rate noted to be rapidly decreasing.  Last heart rate noted 30.  Called for help.  Robinul 0.2% IM given in Left deltoid per MD order.  Procedure completed.  Oxygen delivered at 10 L via blow by.  Patient transferred to stretcher, now patient is responding to verbal stimuli.  PIV placed.  Cannula applied with O2 liters.  Transferred to PACU for recovery. Report to K. Tice RN

## 2022-07-11 ENCOUNTER — Telehealth: Payer: Self-pay | Admitting: *Deleted

## 2022-07-11 NOTE — Telephone Encounter (Signed)
No problems post procedure. States headache may be a bit better today.

## 2022-08-14 ENCOUNTER — Ambulatory Visit
Payer: BC Managed Care – PPO | Attending: Student in an Organized Health Care Education/Training Program | Admitting: Student in an Organized Health Care Education/Training Program

## 2022-08-14 DIAGNOSIS — R519 Headache, unspecified: Secondary | ICD-10-CM

## 2022-08-14 DIAGNOSIS — G8929 Other chronic pain: Secondary | ICD-10-CM

## 2022-08-14 DIAGNOSIS — G9600 Cerebrospinal fluid leak, unspecified: Secondary | ICD-10-CM

## 2022-08-14 NOTE — Progress Notes (Signed)
Patient: Mark Bates  Service Category: E/M  Provider: Gillis Santa, MD  DOB: [birth year]  DOS: 08/14/2022  Location: Office  MRN: [id number]  Setting: Ambulatory outpatient  Referring Provider: Maryland Pink, MD  Type: Established Patient  Specialty: Interventional Pain Management  PCP: Maryland Pink, MD  Location: Remote location  Delivery: TeleHealth     Virtual Encounter - Pain Management PROVIDER NOTE: Information contained herein reflects review and annotations entered in association with encounter. Interpretation of such information and data should be left to medically-trained personnel. Information provided to patient can be located elsewhere in the medical record under "Patient Instructions". Document created using STT-dictation technology, any transcriptional errors that may result from process are unintentional.    Contact & Pharmacy Preferred: [phone] Home: [phone] (home) Mobile: [phone] (mobile) E-mail: [email or web address]  [place] [id number], [place] [address] Phone: [phone] Fax: [phone]   Pre-screening  [name] offered "in-person" vs "virtual" encounter. He indicated preferring virtual for this encounter.   Reason COVID-19*  Social distancing based on CDC and AMA recommendations.   I contacted Mark Bates on 08/14/2022 via telephone.      I clearly identified myself as Gillis Santa, MD. I verified that I was speaking with the correct person using two identifiers (Name: Mark Bates, and date of birth: Nov 22, 1966).  Consent I sought verbal advanced consent from Mark Bates for virtual visit interactions. I informed [name] of possible security and privacy concerns, risks, and limitations associated with providing "not-in-person" medical evaluation and management services. I also informed [name] of the availability of "in-person" appointments. Finally, I informed him that  there would be a charge for the virtual visit and that he could be  personally, fully or partially, financially responsible for it. [name] expressed understanding and agreed to proceed.   Historic Elements   [name] is a 56 y.o. year old, male patient evaluated today after our last contact on 07/10/2022. [name]  has a past medical history of Depression, Diverticulitis, Esophageal motility disorder, Gastritis, GERD (gastroesophageal reflux disease), Hypertension, Intestinal metaplasia of gastric mucosa, Irregular Z line of esophagus, and Irritable bowel syndrome (IBS). He also  has a past surgical history that includes Knee arthroscopy (Bilateral, X3); Gallbladder surgery; Esophagogastroduodenoscopy (egd) with propofol (N/A, 07/23/2015); Cholecystectomy; Colonoscopy; Wrist surgery; and Esophagogastroduodenoscopy (egd) with propofol (N/A, 12/27/2018). [name] has a current medication list which includes the following prescription(s): amlodipine, baclofen, cetirizine, dexlansoprazole, dicyclomine, fluticasone, lisinopril, multivitamin with minerals, OVER THE COUNTER MEDICATION, phillips colon health, hyoscyamine, ibandronate, meloxicam, prednisone, and wheat dextrin. He  reports that he quit smoking about 36 years ago. His smoking use included cigarettes. He has never used smokeless tobacco. He reports current alcohol use. He reports that he does not use drugs. [name] has No Known Allergies.   HPI  Today, he is being contacted for a post-procedure assessment.   Post-procedure evaluation   Procedure: Epidural blood patch at L3/4 with 15 cc of heme injected Laterality: Midline Paramedial  Level: Lumbosacral   Effectiveness:  Initial hour after procedure: 0 %  Subsequent 4-6 hours post-procedure: 0 %  Analgesia past initial 6 hours: 0 % (h[facility] appt [facility])  Ongoing improvement:  Analgesic:  0% Function: No benefit ROM: No benefit   Laboratory Chemistry Profile    Renal Lab Results  Component Value Date   BUN 14 02/28/2022   CREATININE 1.11  02/28/2022   GFRAA >60 06/29/2020   GFRNONAA >60 02/28/2022    Hepatic Lab Results  Component Value Date   AST 24 02/28/2022   ALT 30 02/28/2022   ALBUMIN 4.2 02/28/2022   ALKPHOS 113 02/28/2022    Electrolytes Lab Results  Component Value Date   NA 141 02/28/2022   K 3.8 02/28/2022   CL 107 02/28/2022   CALCIUM 9.1 02/28/2022    Bone No results found for: "VD25OH", "VD125OH2TOT", "ZH2992EQ6", "ST4196QI2", "25OHVITD1", "25OHVITD2", "25OHVITD3", "TESTOFREE", "TESTOSTERONE"  Inflammation (CRP: Acute Phase) (ESR: Chronic Phase) Lab Results  Component Value Date   ESRSEDRATE 5 02/09/2014         Note: Above Lab results reviewed.  Imaging  DG PAIN CLINIC C-ARM 1-60 MIN NO REPORT Fluoro was used, but no Radiologist interpretation will be provided.  Please refer to "NOTES" tab for provider progress note.  Assessment  The primary encounter diagnosis was CSF leak (Spontaneous dx by Dr Manuella Ghazi). A diagnosis of Chronic nonintractable headache, unspecified headache type was also pertinent to this visit.  Plan of Care  Unfortunately, no benefit from lumbar epidural blood patch.  He has an upcoming appointment with [facility]. Continue care with neurology.    Follow-up plan:   No follow-ups on file.    Recent Visits Date Type Provider Dept  07/10/22 Procedure visit Gillis Santa, MD [facility]  07/05/22 Office Visit Gillis Santa, MD [facility]  Showing recent visits within past 90 days and meeting all other requirements Today's Visits Date Type Provider Dept  08/14/22 Office Visit Gillis Santa, MD [facility]  Showing today's visits and meeting all other requirements Future Appointments No visits were found meeting these conditions. Showing future appointments within next 90 days and meeting all other requirements  I discussed the assessment and  treatment plan with the patient. The patient was provided an opportunity to ask questions and all were answered. The patient agreed with the plan and demonstrated an understanding of the instructions.  Patient advised to call back or seek an in-person evaluation if the symptoms or condition worsens.  Duration of encounter: 58minutes.  Note by: Gillis Santa, MD Date: 08/14/2022; Time: 3:16 PM

## 2022-10-04 ENCOUNTER — Ambulatory Visit (INDEPENDENT_AMBULATORY_CARE_PROVIDER_SITE_OTHER): Payer: Self-pay

## 2022-10-04 DIAGNOSIS — Z23 Encounter for immunization: Secondary | ICD-10-CM

## 2023-03-22 ENCOUNTER — Other Ambulatory Visit: Payer: Self-pay

## 2023-03-22 MED ORDER — GABAPENTIN 300 MG PO CAPS
300.0000 mg | ORAL_CAPSULE | Freq: Two times a day (BID) | ORAL | 9 refills | Status: AC
  Filled 2023-03-22: qty 60, 30d supply, fill #0
  Filled 2023-04-22: qty 60, 30d supply, fill #1
  Filled 2023-05-23: qty 60, 30d supply, fill #2
  Filled 2023-06-25: qty 60, 30d supply, fill #3
  Filled 2023-07-23: qty 60, 30d supply, fill #4
  Filled 2023-08-20: qty 60, 30d supply, fill #5

## 2023-04-11 ENCOUNTER — Other Ambulatory Visit: Payer: Self-pay

## 2023-04-11 MED ORDER — AMLODIPINE BESYLATE 10 MG PO TABS
10.0000 mg | ORAL_TABLET | Freq: Every day | ORAL | 1 refills | Status: AC
  Filled 2023-04-11 (×2): qty 90, 90d supply, fill #0

## 2023-04-11 MED ORDER — BACLOFEN 10 MG PO TABS
10.0000 mg | ORAL_TABLET | Freq: Two times a day (BID) | ORAL | 7 refills | Status: AC | PRN
  Filled 2023-04-11: qty 60, 30d supply, fill #0
  Filled 2023-05-15: qty 60, 30d supply, fill #1
  Filled 2023-06-17: qty 60, 30d supply, fill #2
  Filled 2023-07-15: qty 60, 30d supply, fill #3
  Filled 2023-08-20: qty 60, 30d supply, fill #4

## 2023-04-12 ENCOUNTER — Other Ambulatory Visit: Payer: Self-pay

## 2023-04-13 ENCOUNTER — Other Ambulatory Visit: Payer: Self-pay

## 2023-04-17 ENCOUNTER — Other Ambulatory Visit: Payer: Self-pay

## 2023-04-17 MED ORDER — CEFDINIR 300 MG PO CAPS
300.0000 mg | ORAL_CAPSULE | Freq: Two times a day (BID) | ORAL | 0 refills | Status: AC
  Filled 2023-04-17: qty 20, 10d supply, fill #0

## 2023-04-17 MED ORDER — PREDNISONE 20 MG PO TABS
20.0000 mg | ORAL_TABLET | Freq: Two times a day (BID) | ORAL | 0 refills | Status: AC
  Filled 2023-04-17: qty 10, 5d supply, fill #0

## 2023-04-17 MED ORDER — PROMETHAZINE-DM 6.25-15 MG/5ML PO SYRP
5.0000 mL | ORAL_SOLUTION | Freq: Four times a day (QID) | ORAL | 0 refills | Status: AC | PRN
  Filled 2023-04-17: qty 120, 6d supply, fill #0

## 2023-05-09 ENCOUNTER — Other Ambulatory Visit: Payer: Self-pay

## 2023-05-09 MED ORDER — PANTOPRAZOLE SODIUM 40 MG PO TBEC
40.0000 mg | DELAYED_RELEASE_TABLET | Freq: Two times a day (BID) | ORAL | 0 refills | Status: DC
  Filled 2023-05-09: qty 60, 30d supply, fill #0

## 2023-06-04 ENCOUNTER — Other Ambulatory Visit: Payer: Self-pay

## 2023-06-05 ENCOUNTER — Other Ambulatory Visit: Payer: Self-pay

## 2023-06-05 MED ORDER — PANTOPRAZOLE SODIUM 40 MG PO TBEC
40.0000 mg | DELAYED_RELEASE_TABLET | Freq: Two times a day (BID) | ORAL | 0 refills | Status: AC
  Filled 2023-06-05: qty 60, 30d supply, fill #0

## 2023-06-06 ENCOUNTER — Other Ambulatory Visit: Payer: Self-pay

## 2023-06-06 MED ORDER — PANTOPRAZOLE SODIUM 40 MG PO TBEC
40.0000 mg | DELAYED_RELEASE_TABLET | Freq: Two times a day (BID) | ORAL | 8 refills | Status: AC
  Filled 2023-06-06: qty 60, 30d supply, fill #0

## 2023-06-13 ENCOUNTER — Other Ambulatory Visit: Payer: Self-pay

## 2023-06-13 MED ORDER — PANTOPRAZOLE SODIUM 40 MG PO TBEC
40.0000 mg | DELAYED_RELEASE_TABLET | Freq: Two times a day (BID) | ORAL | 3 refills | Status: AC
  Filled 2023-07-15: qty 180, 90d supply, fill #0

## 2023-06-13 MED ORDER — DICYCLOMINE HCL 10 MG PO CAPS
10.0000 mg | ORAL_CAPSULE | Freq: Two times a day (BID) | ORAL | 3 refills | Status: AC | PRN
  Filled 2023-06-13: qty 120, 60d supply, fill #0

## 2023-06-28 ENCOUNTER — Other Ambulatory Visit: Payer: Self-pay

## 2023-06-28 MED ORDER — LISINOPRIL 5 MG PO TABS
10.0000 mg | ORAL_TABLET | Freq: Every day | ORAL | 0 refills | Status: DC
  Filled 2023-06-28: qty 180, 90d supply, fill #0

## 2023-07-05 ENCOUNTER — Other Ambulatory Visit: Payer: Self-pay

## 2023-07-05 MED ORDER — AMLODIPINE BESYLATE 10 MG PO TABS
10.0000 mg | ORAL_TABLET | Freq: Every day | ORAL | 3 refills | Status: AC
  Filled 2023-07-05: qty 90, 90d supply, fill #0

## 2023-07-05 MED ORDER — LISINOPRIL 5 MG PO TABS
10.0000 mg | ORAL_TABLET | Freq: Every day | ORAL | 3 refills | Status: AC
  Filled 2023-07-05: qty 180, 90d supply, fill #0

## 2023-07-06 ENCOUNTER — Other Ambulatory Visit: Payer: Self-pay

## 2023-07-15 ENCOUNTER — Other Ambulatory Visit: Payer: Self-pay

## 2023-07-16 ENCOUNTER — Other Ambulatory Visit: Payer: Self-pay

## 2023-07-16 MED ORDER — PANTOPRAZOLE SODIUM 40 MG PO TBEC
40.0000 mg | DELAYED_RELEASE_TABLET | Freq: Two times a day (BID) | ORAL | 3 refills | Status: AC
  Filled 2023-07-16: qty 180, 90d supply, fill #0

## 2023-09-11 ENCOUNTER — Ambulatory Visit (INDEPENDENT_AMBULATORY_CARE_PROVIDER_SITE_OTHER): Payer: Self-pay

## 2023-09-11 DIAGNOSIS — Z23 Encounter for immunization: Secondary | ICD-10-CM

## 2023-09-25 ENCOUNTER — Other Ambulatory Visit: Payer: Self-pay

## 2024-05-04 ENCOUNTER — Other Ambulatory Visit: Payer: Self-pay

## 2024-05-04 ENCOUNTER — Emergency Department
Admission: EM | Admit: 2024-05-04 | Discharge: 2024-05-04 | Disposition: A | Discharge status: Home / Self Care | Attending: Emergency Medicine | Admitting: Emergency Medicine

## 2024-05-04 ENCOUNTER — Emergency Department

## 2024-05-04 DIAGNOSIS — K529 Noninfective gastroenteritis and colitis, unspecified: Secondary | ICD-10-CM | POA: Diagnosis not present

## 2024-05-04 DIAGNOSIS — R197 Diarrhea, unspecified: Secondary | ICD-10-CM | POA: Diagnosis present

## 2024-05-04 LAB — CBC
HCT: 44.2 % (ref 39.0–52.0)
Hemoglobin: 15 g/dL (ref 13.0–17.0)
MCH: 30.1 pg (ref 26.0–34.0)
MCHC: 33.9 g/dL (ref 30.0–36.0)
MCV: 88.6 fL (ref 80.0–100.0)
Platelets: 221 10*3/uL (ref 150–400)
RBC: 4.99 MIL/uL (ref 4.22–5.81)
RDW: 12.9 % (ref 11.5–15.5)
WBC: 8.9 10*3/uL (ref 4.0–10.5)
nRBC: 0 % (ref 0.0–0.2)

## 2024-05-04 LAB — COMPREHENSIVE METABOLIC PANEL WITH GFR
ALT: 31 U/L (ref 0–44)
AST: 26 U/L (ref 15–41)
Albumin: 4.4 g/dL (ref 3.5–5.0)
Alkaline Phosphatase: 106 U/L (ref 38–126)
Anion gap: 11 (ref 5–15)
BUN: 21 mg/dL — ABNORMAL HIGH (ref 6–20)
CO2: 20 mmol/L — ABNORMAL LOW (ref 22–32)
Calcium: 8.8 mg/dL — ABNORMAL LOW (ref 8.9–10.3)
Chloride: 110 mmol/L (ref 98–111)
Creatinine, Ser: 1.16 mg/dL (ref 0.61–1.24)
GFR, Estimated: >60 mL/min (ref >60)
Glucose, Bld: 120 mg/dL — ABNORMAL HIGH (ref 70–99)
Potassium: 4.3 mmol/L (ref 3.5–5.1)
Sodium: 141 mmol/L (ref 135–145)
Total Bilirubin: 0.8 mg/dL (ref 0.0–1.2)
Total Protein: 7.5 g/dL (ref 6.5–8.1)

## 2024-05-04 LAB — URINALYSIS, ROUTINE W REFLEX MICROSCOPIC
Bilirubin Urine: NEGATIVE
Glucose, UA: NEGATIVE mg/dL
Hgb urine dipstick: NEGATIVE
Ketones, ur: NEGATIVE mg/dL
Leukocytes,Ua: NEGATIVE
Nitrite: NEGATIVE
Protein, ur: NEGATIVE mg/dL
Specific Gravity, Urine: >1.046 — ABNORMAL HIGH (ref 1.005–1.030)
pH: 6 (ref 5.0–8.0)

## 2024-05-04 LAB — LIPASE, BLOOD: Lipase: 37 U/L (ref 11–51)

## 2024-05-04 MED ORDER — ONDANSETRON 4 MG PO TBDP: 4.0000 mg | ORAL_TABLET | Freq: Three times a day (TID) | ORAL | 0 refills | Status: AC | PRN

## 2024-05-04 MED ORDER — IOHEXOL 300 MG/ML  SOLN
100.0000 mL | Freq: Once | INTRAMUSCULAR | Status: AC | PRN
  Administered 2024-05-04: 100 mL via INTRAVENOUS

## 2024-05-04 MED ORDER — SODIUM CHLORIDE 0.9 % IV BOLUS
500.0000 mL | Freq: Once | INTRAVENOUS | Status: AC
  Administered 2024-05-04: 500 mL via INTRAVENOUS

## 2024-05-04 MED ORDER — SODIUM CHLORIDE 0.9 % IV BOLUS
1500.0000 mL | Freq: Once | INTRAVENOUS | Status: AC
  Administered 2024-05-04: 1500 mL via INTRAVENOUS

## 2024-05-04 MED ORDER — LOPERAMIDE HCL 2 MG PO CAPS
4.0000 mg | ORAL_CAPSULE | Freq: Once | ORAL | Status: AC
  Administered 2024-05-04: 4 mg via ORAL
  Filled 2024-05-04: qty 2

## 2024-05-04 NOTE — ED Notes (Signed)
 Labs sent

## 2024-05-04 NOTE — ED Provider Notes (Signed)
 Patient did feel better after IV fluids. Was able to ambulate without any dizziness. CT without concerning abnormality. At this time will plan on discharging. Discussed hydration with patient. He was not able to supply stool sample here so did discuss follow up with PCP who could order one as an outpatient.    Marylynn Soho, MD 05/04/24 [phone]

## 2024-05-04 NOTE — ED Triage Notes (Signed)
 Pt sts that he has been having diarrhea since Thursday. Pt sts that he has been going all the time. Pt sts that Friday he felt better but then has gotten worse yesterday then this AM. Pt sts that he thinks that he is having a IBS attack.

## 2024-05-04 NOTE — ED Provider Notes (Signed)
 [facility] Provider Note    Event Date/Time   First MD Initiated Contact with Patient 05/04/24 1120     (approximate)   History   Diarrhea  HPI  Mark Bates is a 58 y.o. male reports since Wednesday has been having large amounts of loose watery stool.  He is also had some pain in the left lower abdomen.  He vomited once last night but reports he felt like it was more related to just having frequent diarrhea.  He has a history of irritable bowel with loose stools and diarrhea.  He has not been on any recent antibiotics.  He does also have a history of diverticulitis treated with antibiotics in the distant past  Starting Wednesday he noticed 4 loose watery stools and then daily has been having loose watery stools several reporting upwards of 10 yesterday.  At 1 point while having loose stools he started to feel lightheaded and he had to go to the floor and his wife reports for a very brief period of time it seemed as though he almost passed out or may have briefly passed out.  That was yesterday.  He still eating some had macaroni and cheese some fluids and mashed potatoes over the last 24 hours.  No bloody vomitus or diarrhea.  Reports some moderate pain in his left lower abdomen crampy in nature  No travel history     Physical Exam   Triage Vital Signs: ED Triage Vitals [05/04/24 1107]  Encounter Vitals Group     BP 112/77     Systolic BP Percentile      Diastolic BP Percentile      Pulse Rate (!) 104     Resp 17     Temp 98.1 F (36.7 C)     Temp Source Oral     SpO2 97 %     Weight 278 lb (126.1 kg)     Height 6\' 3"  (1.905 m)     Head Circumference      Peak Flow      Pain Score 5     Pain Loc      Pain Education      Exclude from Growth Chart     Most recent vital signs: Vitals:   05/04/24 1645 05/04/24 1700  BP: 127/81 126/75  Pulse: 96 (!) 58  Resp: 14 16  Temp:    SpO2: 100% 98%    Normocephalic atraumatic General: Awake,  no distress.  Pleasant without distress.  Mucous membranes slightly dry CV:  Good peripheral perfusion.  Normal tones rate approximately 90 Resp:  Normal effort.  Clear bilateral Abd:  No distention.  Soft nontender nondistended except in the left lower quadrant where reports mild tenderness to moderate tenderness without rebound or guarding.  There is no hernias or masses Other:  Moves extremities well very pleasant without distress.  Wife also at bedside   ED Results / Procedures / Treatments   Labs (all labs ordered are listed, but only abnormal results are displayed) Labs Reviewed  COMPREHENSIVE METABOLIC PANEL WITH GFR - Abnormal; Notable for the following components:      Result Value   CO2 20 (*)    Glucose, Bld 120 (*)    BUN 21 (*)    Calcium 8.8 (*)    All other components within normal limits  URINALYSIS, ROUTINE W REFLEX MICROSCOPIC - Abnormal; Notable for the following components:   Color, Urine YELLOW (*)  APPearance CLEAR (*)    Specific Gravity, Urine >1.046 (*)    All other components within normal limits  LIPASE, BLOOD  CBC     EKG  ED ECG REPORT I, Iver Marker, the attending physician, personally viewed and interpreted this ECG.  Date: 05/04/2024 EKG Time: 1120 Rate: 85 Rhythm: normal sinus rhythm QRS Axis: normal Intervals: normal ST/T Wave abnormalities: normal Narrative Interpretation: no evidence of acute ischemia    RADIOLOGY  CT abdomen pelvis interpreted by me as grossly negative for acute process  CT ABDOMEN PELVIS W CONTRAST Result Date: 05/04/2024 CLINICAL DATA:  LLQ abdominal pain, diarrhea EXAM: CT ABDOMEN AND PELVIS WITH CONTRAST TECHNIQUE: Multidetector CT imaging of the abdomen and pelvis was performed using the standard protocol following bolus administration of intravenous contrast. RADIATION DOSE REDUCTION: This exam was performed according to the departmental dose-optimization program which includes automated exposure control,  adjustment of the mA and/or kV according to patient size and/or use of iterative reconstruction technique. CONTRAST:  OMNIPAQUE  IOHEXOL  300 MG/ML  SOLN COMPARISON:  August 05, 2014 FINDINGS: Lower chest: No focal airspace consolidation or pleural effusion.Posterior bibasilar dependent atelectasis. Hepatobiliary: No mass.Cholecystectomy. No intrahepatic or extrahepatic biliary ductal dilation. The portal veins are patent. Pancreas: No mass or main ductal dilation. No peripancreatic inflammation or fluid collection. Spleen: Normal size. No mass. Adrenals/Urinary Tract: No adrenal masses. 1.3 cm cyst in the right lower pole. The small interpolar region cyst on the prior CT is much smaller on today's exam, possibly collapsed. No nephrolithiasis or hydronephrosis. The urinary bladder is distended without focal abnormality. Stomach/Bowel: The stomach is decompressed without focal abnormality. No small bowel wall thickening or inflammation. No small bowel obstruction.Normal appendix. Submucosal fat deposition scattered throughout the colon, possibly due to chronic inflammation or obesity. Descending and sigmoid colonic diverticulosis. No changes of acute diverticulitis. Vascular/Lymphatic: No aortic aneurysm. Scattered aortoiliac atherosclerosis. no intraabdominal or pelvic lymphadenopathy. Reproductive: No prostatomegaly.No free pelvic fluid. Other: No pneumoperitoneum, ascites, or mesenteric inflammation. Musculoskeletal: No acute fracture or destructive lesion. Multilevel degenerative disc disease of the spine. IMPRESSION: 1. No acute intraabdominal or pelvic abnormality. 2. Descending and sigmoid colonic diverticulosis. No changes of acute diverticulitis. Electronically Signed   By: Rance Burrows M.D.   On: 05/04/2024 12:27        PROCEDURES:  Critical Care performed: No  Procedures   MEDICATIONS ORDERED IN ED: Medications  sodium chloride  0.9 % bolus 1,500 mL (0 mLs Intravenous Stopped 05/04/24  1517)  iohexol  (OMNIPAQUE ) 300 MG/ML solution 100 mL (100 mLs Intravenous Contrast Given 05/04/24 1157)  loperamide (IMODIUM) capsule 4 mg (4 mg Oral Given 05/04/24 1254)  sodium chloride  0.9 % bolus 500 mL (0 mLs Intravenous Stopped 05/04/24 1728)     IMPRESSION / MDM / ASSESSMENT AND PLAN / ED COURSE  I reviewed the triage vital signs and the nursing notes.                              Differential diagnosis includes, but is not limited to, possible flare of IBS, viral or bacterial colitis type symptoms, diverticulitis, enteritis etc. seem high in the differential.  Other considerations before less likely would include causes such as pancreatitis, ischemic, bleeding, AVM peptic ulcer etc.  He reports no bleeding his hemoglobin is appropriate hemodynamics are reassuring with exception to slight tachycardia.  He gives a history that sounds like significant amount of volume and water loss and had a syncopal  or near syncopal episode yesterday without fall or injury.  He is neurovascularly intact fully awake and alert reports feeling dehydrated.  Will hydrate generously check imaging and labs.  Discussed with patient currently not wanting anything for pain or any antiemetic.    Patient's presentation is most consistent with acute complicated illness / injury requiring diagnostic workup.   Patient is feeling much better after receiving a liter and a half of IV fluid.  No further diarrhea in the ER, symptoms well-controlled.  After review of his CT imaging I suspect this is likely self-limited possible enteritis or related to IBS.  No definitive findings of diverticulitis.  Afebrile normal white count.  No further nausea vomiting or diarrhea while in the emergency department which is reassuring.  Ongoing care assigned to Dr. Hendrick Locke with plan to reassess after additional 500 mL of normal saline for hydration and follow-up on pending urinalysis. Unable to obtain stool sample, but deemed low risk for c. Diff  without recent antibiotics or history of same.        FINAL CLINICAL IMPRESSION(S) / ED DIAGNOSES   Final diagnoses:  Diarrhea, unspecified type  Enteritis     Rx / DC Orders   ED Discharge Orders          Ordered    ondansetron (ZOFRAN-ODT) 4 MG disintegrating tablet  Every 8 hours PRN        05/04/24 1715             Note:  This document was prepared using Dragon voice recognition software and may include unintentional dictation errors.   Iver Marker, MD 05/05/24 1534

## 2024-05-04 NOTE — ED Notes (Signed)
 Urine cup placed at bedside. Pt instructed to call this RN when ready to use the bathroom.

## 2024-05-04 NOTE — ED Notes (Signed)
 Pt has been having episodes of diarrhea for the past few days. Pt reports it became worse last night with more than 20 episodes. Per pt wife she believes he passed out at one point last night while lying on the bathroom floor. Pt c/o lower left abd pain that has had no relief. Has hx of IBS but states this feels different from previous episodes. Pt also being seen by [facility] for a CSF leak. Pt A&Ox4. Wife at bedside.

## 2024-06-05 ENCOUNTER — Other Ambulatory Visit: Payer: Self-pay

## 2024-06-05 DIAGNOSIS — R202 Paresthesia of skin: Secondary | ICD-10-CM

## 2024-06-05 DIAGNOSIS — R519 Headache, unspecified: Secondary | ICD-10-CM

## 2024-06-05 DIAGNOSIS — H9313 Tinnitus, bilateral: Secondary | ICD-10-CM

## 2024-06-05 DIAGNOSIS — G9600 Cerebrospinal fluid leak, unspecified: Secondary | ICD-10-CM

## 2024-06-10 ENCOUNTER — Ambulatory Visit
Admission: RE | Admit: 2024-06-10 | Discharge: 2024-06-10 | Disposition: A | Discharge status: Home / Self Care | Source: Ambulatory Visit

## 2024-06-10 DIAGNOSIS — R519 Headache, unspecified: Secondary | ICD-10-CM | POA: Insufficient documentation

## 2024-06-10 DIAGNOSIS — R202 Paresthesia of skin: Secondary | ICD-10-CM | POA: Insufficient documentation

## 2024-06-10 DIAGNOSIS — H9313 Tinnitus, bilateral: Secondary | ICD-10-CM | POA: Insufficient documentation

## 2024-06-10 DIAGNOSIS — G9600 Cerebrospinal fluid leak, unspecified: Secondary | ICD-10-CM | POA: Diagnosis present

## 2024-06-10 DIAGNOSIS — G939 Disorder of brain, unspecified: Secondary | ICD-10-CM | POA: Diagnosis present

## 2024-06-10 MED ORDER — GADOBUTROL 1 MMOL/ML IV SOLN
10.0000 mL | Freq: Once | INTRAVENOUS | Status: AC | PRN
  Administered 2024-06-10: 10 mL via INTRAVENOUS

## 2024-10-14 ENCOUNTER — Ambulatory Visit: Payer: Self-pay

## 2024-10-14 DIAGNOSIS — Z23 Encounter for immunization: Secondary | ICD-10-CM

## 2024-12-18 ENCOUNTER — Other Ambulatory Visit: Payer: Self-pay

## 2024-12-18 DIAGNOSIS — R519 Headache, unspecified: Secondary | ICD-10-CM

## 2024-12-18 DIAGNOSIS — R42 Dizziness and giddiness: Secondary | ICD-10-CM

## 2024-12-18 DIAGNOSIS — H9313 Tinnitus, bilateral: Secondary | ICD-10-CM

## 2024-12-18 DIAGNOSIS — G9600 Cerebrospinal fluid leak, unspecified: Secondary | ICD-10-CM
